# Patient Record
Sex: Female | Born: 1970 | Race: Black or African American | Hispanic: No | Smoking: Former smoker
Health system: Southern US, Community
[De-identification: ages and names within clinical notes are randomized; demographics above are authoritative.]

## PROBLEM LIST (undated history)

## (undated) DIAGNOSIS — J45909 Unspecified asthma, uncomplicated: Secondary | ICD-10-CM

---

## 2015-08-14 ENCOUNTER — Emergency Department (HOSPITAL_COMMUNITY): Payer: Self-pay

## 2015-08-14 ENCOUNTER — Encounter (HOSPITAL_COMMUNITY): Payer: Self-pay | Admitting: *Deleted

## 2015-08-14 ENCOUNTER — Emergency Department (HOSPITAL_COMMUNITY)
Admission: EM | Admit: 2015-08-14 | Discharge: 2015-08-14 | Disposition: A | Discharge status: Home / Self Care | Payer: Self-pay | Attending: Emergency Medicine | Admitting: Emergency Medicine

## 2015-08-14 DIAGNOSIS — N83201 Unspecified ovarian cyst, right side: Secondary | ICD-10-CM

## 2015-08-14 DIAGNOSIS — D270 Benign neoplasm of right ovary: Secondary | ICD-10-CM

## 2015-08-14 DIAGNOSIS — J45909 Unspecified asthma, uncomplicated: Secondary | ICD-10-CM | POA: Insufficient documentation

## 2015-08-14 DIAGNOSIS — R1031 Right lower quadrant pain: Secondary | ICD-10-CM | POA: Insufficient documentation

## 2015-08-14 HISTORY — DX: Unspecified asthma, uncomplicated: J45.909

## 2015-08-14 LAB — LIPASE, BLOOD: LIPASE: 28 U/L (ref 11–51)

## 2015-08-14 LAB — URINALYSIS, ROUTINE W REFLEX MICROSCOPIC
BILIRUBIN URINE: NEGATIVE
GLUCOSE, UA: NEGATIVE mg/dL
HGB URINE DIPSTICK: NEGATIVE
Ketones, ur: NEGATIVE mg/dL
Leukocytes, UA: NEGATIVE
Nitrite: NEGATIVE
PROTEIN: NEGATIVE mg/dL
Specific Gravity, Urine: 1.018 (ref 1.005–1.030)
pH: 5.5 (ref 5.0–8.0)

## 2015-08-14 LAB — POC URINE PREG, ED: PREG TEST UR: NEGATIVE

## 2015-08-14 LAB — COMPREHENSIVE METABOLIC PANEL
ALK PHOS: 87 U/L (ref 38–126)
ALT: 20 U/L (ref 14–54)
AST: 23 U/L (ref 15–41)
Albumin: 3.7 g/dL (ref 3.5–5.0)
Anion gap: 5 (ref 5–15)
BUN: 10 mg/dL (ref 6–20)
CALCIUM: 9.1 mg/dL (ref 8.9–10.3)
CHLORIDE: 108 mmol/L (ref 101–111)
CO2: 24 mmol/L (ref 22–32)
CREATININE: 0.77 mg/dL (ref 0.44–1.00)
GFR calc non Af Amer: >60 mL/min (ref >60)
GLUCOSE: 121 mg/dL — AB (ref 65–99)
Potassium: 4.2 mmol/L (ref 3.5–5.1)
SODIUM: 137 mmol/L (ref 135–145)
Total Bilirubin: 0.3 mg/dL (ref 0.3–1.2)
Total Protein: 6.6 g/dL (ref 6.5–8.1)

## 2015-08-14 LAB — CBC
HCT: 34.7 % — ABNORMAL LOW (ref 36.0–46.0)
Hemoglobin: 11.6 g/dL — ABNORMAL LOW (ref 12.0–15.0)
MCH: 22.7 pg — AB (ref 26.0–34.0)
MCHC: 33.4 g/dL (ref 30.0–36.0)
MCV: 67.8 fL — AB (ref 78.0–100.0)
PLATELETS: 195 10*3/uL (ref 150–400)
RBC: 5.12 MIL/uL — ABNORMAL HIGH (ref 3.87–5.11)
RDW: 14.5 % (ref 11.5–15.5)
WBC: 4.9 10*3/uL (ref 4.0–10.5)

## 2015-08-14 MED ORDER — IOPAMIDOL (ISOVUE-300) INJECTION 61%
INTRAVENOUS | Status: AC
  Administered 2015-08-14: 100 mL
  Filled 2015-08-14: qty 100

## 2015-08-14 MED ORDER — IBUPROFEN 400 MG PO TABS
400.0000 mg | ORAL_TABLET | Freq: Once | ORAL | Status: AC
  Administered 2015-08-14: 400 mg via ORAL
  Filled 2015-08-14: qty 1

## 2015-08-14 MED ORDER — IOPAMIDOL (ISOVUE-300) INJECTION 61%
INTRAVENOUS | Status: AC
  Filled 2015-08-14: qty 100

## 2015-08-14 MED ORDER — HYDROCODONE-ACETAMINOPHEN 5-325 MG PO TABS: 1.0000 | ORAL_TABLET | ORAL | Status: DC | PRN

## 2015-08-14 MED ORDER — IBUPROFEN 800 MG PO TABS: 800.0000 mg | ORAL_TABLET | Freq: Three times a day (TID) | ORAL | Status: DC

## 2015-08-14 NOTE — ED Notes (Signed)
Both pt and visitor asleep  c-t will call for the pt soon

## 2015-08-14 NOTE — ED Notes (Signed)
Returned from ultrasound.

## 2015-08-14 NOTE — ED Provider Notes (Signed)
CSN: [id number]     Arrival date & time 08/14/15  0236 History   First MD Initiated Contact with Patient 08/14/15 [phone]     Chief Complaint  Patient presents with  . Abdominal Pain     (Consider location/radiation/quality/duration/timing/severity/associated sxs/prior Treatment) HPI Comments: Patient presents to the ER for evaluation of 24 hours of pain in the right groin area. Patient reports that she has noticed a tender fullness in this area that worsens when she stands up. She denies any injury to the area. Patient reports the pain has progressively worsened. She has not had any fever, nausea, vomiting, diarrhea or constipation.  Patient is a 45 y.o. female presenting with abdominal pain.  Abdominal Pain   Past Medical History  Diagnosis Date  . Asthma    History reviewed. No pertinent past surgical history. No family history on file. Social History  Substance Use Topics  . Smoking status: Never Smoker   . Smokeless tobacco: None  . Alcohol Use: No   OB History    No data available     Review of Systems  Gastrointestinal: Positive for abdominal pain.  All other systems reviewed and are negative.     Allergies  Review of patient's allergies indicates no known allergies.  Home Medications   Prior to Admission medications   Not on File   BP 112/75 mmHg  Pulse 68  Temp(Src) 98.1 F (36.7 C) (Oral)  Resp 16  Wt 150 lb (68.04 kg)  SpO2 100%  LMP 08/07/2015 Physical Exam  Constitutional: She is oriented to person, place, and time. She appears well-developed and well-nourished. No distress.  HENT:  Head: Normocephalic and atraumatic.  Right Ear: Hearing normal.  Left Ear: Hearing normal.  Nose: Nose normal.  Mouth/Throat: Oropharynx is clear and moist and mucous membranes are normal.  Eyes: Conjunctivae and EOM are normal. Pupils are equal, round, and reactive to light.  Neck: Normal range of motion. Neck supple.  Cardiovascular: Regular rhythm, S1 normal  and S2 normal.  Exam reveals no gallop and no friction rub.   No murmur heard. Pulmonary/Chest: Effort normal and breath sounds normal. No respiratory distress. She exhibits no tenderness.  Abdominal: Soft. Normal appearance and bowel sounds are normal. Mass: Tender fullness right inguinal ligament region. There is no hepatosplenomegaly. There is no tenderness. There is no rebound, no guarding, no tenderness at McBurney's point and negative Murphy's sign. No hernia.  Musculoskeletal: Normal range of motion.  Neurological: She is alert and oriented to person, place, and time. She has normal strength. No cranial nerve deficit or sensory deficit. Coordination normal. GCS eye subscore is 4. GCS verbal subscore is 5. GCS motor subscore is 6.  Skin: Skin is warm, dry and intact. No rash noted. No cyanosis.  Psychiatric: She has a normal mood and affect. Her speech is normal and behavior is normal. Thought content normal.  Nursing note and vitals reviewed.   ED Course  Procedures (including critical care time) Labs Review Labs Reviewed  COMPREHENSIVE METABOLIC PANEL - Abnormal; Notable for the following:    Glucose, Bld 121 (*)    All other components within normal limits  CBC - Abnormal; Notable for the following:    RBC 5.12 (*)    Hemoglobin 11.6 (*)    HCT 34.7 (*)    MCV 67.8 (*)    MCH 22.7 (*)    All other components within normal limits  LIPASE, BLOOD  URINALYSIS, ROUTINE W REFLEX MICROSCOPIC (NOT AT [facility])  POC URINE PREG, ED    Imaging Review No results found. I have personally reviewed and evaluated these images and lab results as part of my medical decision-making.   EKG Interpretation None      MDM   Final diagnoses:  Right lower quadrant abdominal pain    Patient presents to the ER for evaluation of right lower abdominal pain. Symptoms present for 24 hours. Patient does not have tenderness at McBurney's point. Area of pain is below the area of the pelvis as well.  She indicates an area in the inguinal crease that is tender and painful with movement. There is some fullness in the region that she feels is a mass. Will perform CT scan to evaluate for possible hernia. Sign out to oncoming ER physician to follow up CT.    Orpah Greek, MD 08/14/15 [phone]

## 2015-08-14 NOTE — ED Notes (Signed)
Pt sleeping. 

## 2015-08-14 NOTE — Discharge Instructions (Signed)
It was our pleasure to provide your ER care today - we hope that you feel better.  You may take motrin or aleve as need for pain.   Your imaging studies were read as showing:  IMPRESSION: 3.2 cm predominantly solid mass with substantial hyperechoic component is noted in right ovary which corresponds to lesion seen on CT scan. This is most consistent with dermoid tumor.  Follow up with ob/gyn doctor in the next few weeks - see referral - call office to arrange appointment. Discuss ultrasound results with them.   Return to ER if worse, new symptoms, fevers, worsening/severe pain, persistent vomiting, other concern.       Abdominal Pain, Adult Many things can cause abdominal pain. Usually, abdominal pain is not caused by a disease and will improve without treatment. It can often be observed and treated at home. Your health care provider will do a physical exam and possibly order blood tests and X-rays to help determine the seriousness of your pain. However, in many cases, more time must pass before a clear cause of the pain can be found. Before that point, your health care provider may not know if you need more testing or further treatment. HOME CARE INSTRUCTIONS Monitor your abdominal pain for any changes. The following actions may help to alleviate any discomfort you are experiencing:  Only take over-the-counter or prescription medicines as directed by your health care provider.  Do not take laxatives unless directed to do so by your health care provider.  Try a clear liquid diet (broth, tea, or water) as directed by your health care provider. Slowly move to a bland diet as tolerated. SEEK MEDICAL CARE IF:  You have unexplained abdominal pain.  You have abdominal pain associated with nausea or diarrhea.  You have pain when you urinate or have a bowel movement.  You experience abdominal pain that wakes you in the night.  You have abdominal pain that is worsened or improved by  eating food.  You have abdominal pain that is worsened with eating fatty foods.  You have a fever. SEEK IMMEDIATE MEDICAL CARE IF:  Your pain does not go away within 2 hours.  You keep throwing up (vomiting).  Your pain is felt only in portions of the abdomen, such as the right side or the left lower portion of the abdomen.  You pass bloody or black tarry stools. MAKE SURE YOU:  Understand these instructions.  Will watch your condition.  Will get help right away if you are not doing well or get worse.   This information is not intended to replace advice given to you by your health care provider. Make sure you discuss any questions you have with your health care provider.   Document Released: 12/14/2004 Document Revised: 11/25/2014 Document Reviewed: 11/13/2012 Elsevier Interactive Patient Education 2016 [employer]   Ovarian Cyst An ovarian cyst is a fluid-filled sac that forms on an ovary. The ovaries are small organs that produce eggs in women. Various types of cysts can form on the ovaries. Most are not cancerous. Many do not cause problems, and they often go away on their own. Some may cause symptoms and require treatment. Common types of ovarian cysts include:  Functional cysts--These cysts may occur every month during the menstrual cycle. This is normal. The cysts usually go away with the next menstrual cycle if the woman does not get pregnant. Usually, there are no symptoms with a functional cyst.  Endometrioma cysts--These cysts form from the tissue  that lines the uterus. They are also called "chocolate cysts" because they become filled with blood that turns brown. This type of cyst can cause pain in the lower abdomen during intercourse and with your menstrual period.  Cystadenoma cysts--This type develops from the cells on the outside of the ovary. These cysts can get very big and cause lower abdomen pain and pain with intercourse. This type of cyst can twist on itself,  cut off its blood supply, and cause severe pain. It can also easily rupture and cause a lot of pain.  Dermoid cysts--This type of cyst is sometimes found in both ovaries. These cysts may contain different kinds of body tissue, such as skin, teeth, hair, or cartilage. They usually do not cause symptoms unless they get very big.  Theca lutein cysts--These cysts occur when too much of a certain hormone (human chorionic gonadotropin) is produced and overstimulates the ovaries to produce an egg. This is most common after procedures used to assist with the conception of a baby (in vitro fertilization). CAUSES   Fertility drugs can cause a condition in which multiple large cysts are formed on the ovaries. This is called ovarian hyperstimulation syndrome.  A condition called polycystic ovary syndrome can cause hormonal imbalances that can lead to nonfunctional ovarian cysts. SIGNS AND SYMPTOMS  Many ovarian cysts do not cause symptoms. If symptoms are present, they may include:  Pelvic pain or pressure.  Pain in the lower abdomen.  Pain during sexual intercourse.  Increasing girth (swelling) of the abdomen.  Abnormal menstrual periods.  Increasing pain with menstrual periods.  Stopping having menstrual periods without being pregnant. DIAGNOSIS  These cysts are commonly found during a routine or annual pelvic exam. Tests may be ordered to find out more about the cyst. These tests may include:  Ultrasound.  X-ray of the pelvis.  CT scan.  MRI.  Blood tests. TREATMENT  Many ovarian cysts go away on their own without treatment. Your health care provider may want to check your cyst regularly for 2-3 months to see if it changes. For women in menopause, it is particularly important to monitor a cyst closely because of the higher rate of ovarian cancer in menopausal women. When treatment is needed, it may include any of the following:  A procedure to drain the cyst (aspiration). This may be  done using a long needle and ultrasound. It can also be done through a laparoscopic procedure. This involves using a thin, lighted tube with a tiny camera on the end (laparoscope) inserted through a small incision.  Surgery to remove the whole cyst. This may be done using laparoscopic surgery or an open surgery involving a larger incision in the lower abdomen.  Hormone treatment or birth control pills. These methods are sometimes used to help dissolve a cyst. HOME CARE INSTRUCTIONS   Only take over-the-counter or prescription medicines as directed by your health care provider.  Follow up with your health care provider as directed.  Get regular pelvic exams and Pap tests. SEEK MEDICAL CARE IF:   Your periods are late, irregular, or painful, or they stop.  Your pelvic pain or abdominal pain does not go away.  Your abdomen becomes larger or swollen.  You have pressure on your bladder or trouble emptying your bladder completely.  You have pain during sexual intercourse.  You have feelings of fullness, pressure, or discomfort in your stomach.  You lose weight for no apparent reason.  You feel generally ill.  You become constipated.  You lose your appetite.  You develop acne.  You have an increase in body and facial hair.  You are gaining weight, without changing your exercise and eating habits.  You think you are pregnant. SEEK IMMEDIATE MEDICAL CARE IF:   You have increasing abdominal pain.  You feel sick to your stomach (nauseous), and you throw up (vomit).  You develop a fever that comes on suddenly.  You have abdominal pain during a bowel movement.  Your menstrual periods become heavier than usual. MAKE SURE YOU:  Understand these instructions.  Will watch your condition.  Will get help right away if you are not doing well or get worse.   This information is not intended to replace advice given to you by your health care provider. Make sure you discuss any  questions you have with your health care provider.   Document Released: 03/06/2005 Document Revised: 03/11/2013 Document Reviewed: 11/11/2012 Elsevier Interactive Patient Education [employer].

## 2015-08-14 NOTE — ED Notes (Signed)
i triaged this pt earlier no change in  Her condition  Alert no distress

## 2015-08-14 NOTE — ED Provider Notes (Signed)
Signed out by Dr Betsey Holiday that patient with CT pending, and that feels likely patient able to be d/cd to home post ct.   Ct results noted, will get u/s.   U/s and ct c/w ovarian cystic ?dermoid.  Discussed results w pt and need gyn follow up.  Pt afeb. Recheck abd soft no tenderness.     Lajean Saver, MD 08/14/15 1055

## 2015-08-14 NOTE — ED Notes (Signed)
The pt is c/o rt lower quadrant pain for 24 hours  No  n v or diarrhea.. lmp last week

## 2015-09-10 ENCOUNTER — Encounter: Payer: Self-pay | Admitting: Obstetrics & Gynecology

## 2015-09-10 ENCOUNTER — Ambulatory Visit (INDEPENDENT_AMBULATORY_CARE_PROVIDER_SITE_OTHER): Payer: Self-pay | Admitting: Obstetrics & Gynecology

## 2015-09-10 VITALS — BP 113/71 | HR 63 | Wt 146.7 lb

## 2015-09-10 DIAGNOSIS — D270 Benign neoplasm of right ovary: Secondary | ICD-10-CM

## 2015-09-10 MED ORDER — NAPROXEN 500 MG PO TABS: 500.0000 mg | ORAL_TABLET | Freq: Two times a day (BID) | ORAL | Status: DC

## 2015-09-10 NOTE — Patient Instructions (Signed)
Unilateral Salpingo-Oophorectomy Unilateral salpingo-oophorectomy is the surgical removal of one fallopian tube and ovary. The ovaries are small organs that produce eggs in women. The fallopian tubes transport the egg from the ovary to the womb (uterus). A unilateral salpingo-oophorectomy may be done for various reasons, including:  Infection in the fallopian tube and ovary.  Scar tissue in the fallopian tube and ovary (adhesions).  A cyst or tumor on the ovary.  A need to remove the fallopian tube and ovary when removing the uterus.  Cancer of the fallopian tube or ovary. The removal of one fallopian tube and ovary will not prevent you from becoming pregnant, put you into menopause, or cause problems with your menstrual periods or sex drive. LET [facility] CARE PROVIDER KNOW ABOUT:  Any allergies you have.  All medicines you are taking, including vitamins, herbs, eye drops, creams, and over-the-counter medicines.  Previous problems you or members of your family have had with the use of anesthetics.  Any blood disorders you have.  Previous surgeries you have had.  Medical conditions you have. RISKS AND COMPLICATIONS  Generally, this is a safe procedure. However, as with any procedure, complications can occur. Possible complications include:  Injury to surrounding organs.  Bleeding.  Infection.  Blood clots in the legs or lungs.  Problems related to anesthesia. BEFORE THE PROCEDURE  Ask your health care provider about changing or stopping your regular medicines. You may need to stop taking certain medicines, such as aspirin or blood thinners, at least 1 week before the surgery.  Do not eat or drink anything for at least 8 hours before the surgery.  If you smoke, do not smoke for at least 2 weeks before the surgery.  Make plans to have someone drive you home after the procedure or after your hospital stay. Also arrange for someone to help you with activities during  recovery. PROCEDURE  You will be given medicine to help you relax before the procedure (sedative). You will then be given medicine to make you sleep through the procedure (general anesthetic). These medicines will be given through an IV access tube that is put into one of your veins.  Once you are asleep, your lower abdomen will be shaved and cleaned. A thin, flexible tube (catheter) will be placed in your bladder.  The surgeon may use a laparoscopic, robotic, or open technique for this surgery:  In the laparoscopic technique, the surgery is done through two small cuts (incisions) in the abdomen. A thin, lighted tube with a tiny camera on the end (laparoscope) is inserted into one of the incisions. The tools needed for the procedure are put through the other incision.  A robotic technique may be chosen to perform complex surgery in a small space. In the robotic technique, small incisions are made. A camera and surgical instruments are passed through the incisions. Surgical instruments are controlled with the help of a robotic arm.  In the open technique, the surgery is done through one large incision in the abdomen.  Using any of these techniques, the surgeon will remove the fallopian tube and ovary. The blood vessels will be clamped and tied.  The surgeon will then use staples or stitches to close the incision or incisions. AFTER THE PROCEDURE  You will be taken to a recovery area where your progress will be monitored for 1-3 hours. Your blood pressure, pulse, and temperature will be checked often. You will remain in the recovery area until you are stable and waking up.  If the laparoscopic technique was used, you may be allowed to go home after several hours. You may have some shoulder pain. This is normal and usually goes away in a day or two.  If the open technique was used, you will be admitted to the hospital for a couple of days.  You will be given pain medicine as necessary.  The  IV tube and catheter will be removed before you are discharged.   This information is not intended to replace advice given to you by your health care provider. Make sure you discuss any questions you have with your health care provider.   Document Released: 01/01/2009 Document Revised: 03/11/2013 Document Reviewed: 08/28/2012 Elsevier Interactive Patient Education [employer].

## 2015-09-10 NOTE — Progress Notes (Signed)
CLINIC ENCOUNTER NOTE  History:  45 y.o. [id number] here today for discussion of surgical management of probable dermoid cyst. She denies any abnormal vaginal discharge, bleeding, pelvic pain or other concerns.   Past Medical History  Diagnosis Date  . Asthma    History reviewed. No pertinent past surgical history.  The following portions of the patient's history were reviewed and updated as appropriate: allergies, current medications, past family history, past medical history, past social history, past surgical history and problem list.   Health Maintenance:  Normal pap in 2016.  Review of Systems:  Pertinent items noted in HPI and remainder of comprehensive ROS otherwise negative.  Objective:  Physical Exam BP 113/71 mmHg  Pulse 63  Wt 146 lb 11.2 oz (66.543 kg)  LMP 08/07/2015 CONSTITUTIONAL: Well-developed, well-nourished female in no acute distress.  HENT:  Normocephalic, atraumatic. External right and left ear normal. Oropharynx is clear and moist EYES: Conjunctivae and EOM are normal. Pupils are equal, round, and reactive to light. No scleral icterus.  NECK: Normal range of motion, supple, no masses SKIN: Skin is warm and dry. No rash noted. Not diaphoretic. No erythema. No pallor. NEUROLOGIC: Alert and oriented to person, place, and time. Normal reflexes, muscle tone coordination. No cranial nerve deficit noted. PSYCHIATRIC: Normal mood and affect. Normal behavior. Normal judgment and thought content. CARDIOVASCULAR: Normal heart rate noted RESPIRATORY: Effort and breath sounds normal, no problems with respiration noted ABDOMEN: Soft, no distention noted.   PELVIC: Normal appearing external genitalia. Normal uterine size, no other palpable masses,mild uterine tenderness and bilateral adnexal tenderness.  MUSCULOSKELETAL: Normal range of motion. No edema noted.  Labs and Imaging US Transvaginal Non-ob  08/14/2015  CLINICAL DATA:  Right ovarian cyst seen on CT scan.  EXAM: TRANSABDOMINAL AND TRANSVAGINAL ULTRASOUND OF PELVIS TECHNIQUE: Both transabdominal and transvaginal ultrasound examinations of the pelvis were performed. Transabdominal technique was performed for global imaging of the pelvis including uterus, ovaries, adnexal regions, and pelvic cul-de-sac. It was necessary to proceed with endovaginal exam following the transabdominal exam to visualize the endometrium and ovaries. COMPARISON:  CT scan of same day. FINDINGS: Uterus Measurements: 10.5 x 6.4 x 5.3 cm. No fibroids or other mass visualized. Endometrium Thickness: 7.8 mm which is within normal limits for patient of reproductive age. No focal abnormality visualized. Right ovary Measurements: 4.7 x 4.1 x 2.1 cm. Complex but predominantly solid mass measuring 3.2 x 2.7 x 2.2 cm is noted which corresponds to abnormality seen on CT scan. It has a substantial hyperechoic solid component and is most consistent with dermoid tumor. Left ovary Measurements: 2.7 x 1.8 x 2.1 cm. Probable small hemorrhagic cyst measuring 1.1 cm is noted. Other findings Trace amount of free fluid is noted which most likely is physiologic. IMPRESSION: 3.2 cm predominantly solid mass with substantial hyperechoic component is noted in right ovary which corresponds to lesion seen on CT scan. This is most consistent with dermoid tumor. If this abnormality is not surgically removed, followup ultrasound in 1 year is recommended to ensure stability. Electronically Signed   By: Marijo Conception, M.D.   On: 08/14/2015 10:44   US Pelvis Complete  08/14/2015  CLINICAL DATA:  Right ovarian cyst seen on CT scan. EXAM: TRANSABDOMINAL AND TRANSVAGINAL ULTRASOUND OF PELVIS TECHNIQUE: Both transabdominal and transvaginal ultrasound examinations of the pelvis were performed. Transabdominal technique was performed for global imaging of the pelvis including uterus, ovaries, adnexal regions, and pelvic cul-de-sac. It was necessary to proceed with endovaginal exam  following  the transabdominal exam to visualize the endometrium and ovaries. COMPARISON:  CT scan of same day. FINDINGS: Uterus Measurements: 10.5 x 6.4 x 5.3 cm. No fibroids or other mass visualized. Endometrium Thickness: 7.8 mm which is within normal limits for patient of reproductive age. No focal abnormality visualized. Right ovary Measurements: 4.7 x 4.1 x 2.1 cm. Complex but predominantly solid mass measuring 3.2 x 2.7 x 2.2 cm is noted which corresponds to abnormality seen on CT scan. It has a substantial hyperechoic solid component and is most consistent with dermoid tumor. Left ovary Measurements: 2.7 x 1.8 x 2.1 cm. Probable small hemorrhagic cyst measuring 1.1 cm is noted. Other findings Trace amount of free fluid is noted which most likely is physiologic. IMPRESSION: 3.2 cm predominantly solid mass with substantial hyperechoic component is noted in right ovary which corresponds to lesion seen on CT scan. This is most consistent with dermoid tumor. If this abnormality is not surgically removed, followup ultrasound in 1 year is recommended to ensure stability. Electronically Signed   By: Marijo Conception, M.D.   On: 08/14/2015 10:44   Ct Abdomen Pelvis W Contrast  08/14/2015  CLINICAL DATA:  Acute right lower quadrant abdominal pain. EXAM: CT ABDOMEN AND PELVIS WITH CONTRAST TECHNIQUE: Multidetector CT imaging of the abdomen and pelvis was performed using the standard protocol following bolus administration of intravenous contrast. CONTRAST:  100 mL ISOVUE-300 IOPAMIDOL (ISOVUE-300) INJECTION 61% COMPARISON:  None. FINDINGS: Visualized lung bases are unremarkable. Old healed fracture seen involving the anterior portion of the right iliac wing. No other osseous abnormality is noted. No gallstones are noted. The liver, spleen and pancreas are unremarkable. Adrenal glands and kidneys appear normal. The appendix appears normal. No hydronephrosis or renal obstruction is noted. There is no evidence of bowel  obstruction. In the posterior portion of the pelvis on the right, fat containing oval-shaped density measuring 3.8 x 2.5 x 3.0 cm is noted with solid components. This is most consistent with dermoid tumor, most likely arising from the right ovary. The uterus appears normal. Left ovary is not clearly visualized. Urinary bladder appears normal. No significant adenopathy is noted. No abnormal fluid collection is noted. IMPRESSION: Normal appendix. No hydronephrosis or renal obstruction is noted. No definite renal or ureteral calculi are noted. 3.8 cm fat containing oval-shaped density seen in posterior portion of right pelvis which is most consistent with ovarian dermoid tumor. Pelvic ultrasound is recommended for further evaluation. Electronically Signed   By: Marijo Conception, M.D.   On: 08/14/2015 07:50    Assessment & Plan:  1. Dermoid cyst of right ovary Patient desires surgical management with laparoscopic right salpingoophorectomy.  The risks of surgery were discussed in detail with the patient including but not limited to: bleeding which may require transfusion or reoperation; infection which may require prolonged hospitalization or re-hospitalization and antibiotic therapy; injury to bowel, bladder, ureters and major vessels or other surrounding organs; need for additional procedures including laparotomy; thromboembolic phenomenon, incisional problems and other postoperative or anesthesia complications.  Patient was told that the likelihood that her condition and symptoms will be treated effectively with this surgical management was very high; the postoperative expectations were also discussed in detail. The patient also understands the alternative treatment options which were discussed in full. All questions were answered.  She was told that she will be contacted by our surgical scheduler regarding the time and date of her surgery; routine preoperative instructions of having nothing to eat or drink after  midnight  on the day prior to surgery and also coming to the hospital 1.5 hours prior to her time of surgery were also emphasized.  She was told she may be called for a preoperative appointment about a week prior to surgery and will be given further preoperative instructions at that visit. Printed patient education handouts about the procedure were given to the patient to review at home.  CA125 drawn today, will follow up results and manage accordingly. Naproxen prescribed for pain as needed.  Please refer to After Visit Summary for other counseling recommendations.   Total face-to-face time with patient: 30 minutes. Over 50% of encounter was spent on counseling and coordination of care.   Verita Schneiders, MD, [place] Attending Obstetrician & Gynecologist, [place] for [facility]

## 2015-09-11 LAB — CA 125: CA 125: 13 U/mL (ref <35)

## 2015-09-20 ENCOUNTER — Encounter (HOSPITAL_COMMUNITY): Payer: Self-pay | Admitting: *Deleted

## 2015-10-01 ENCOUNTER — Encounter (HOSPITAL_COMMUNITY): Payer: Self-pay | Admitting: *Deleted

## 2015-10-19 ENCOUNTER — Encounter (HOSPITAL_COMMUNITY)
Admission: RE | Disposition: A | Discharge status: Home / Self Care | Payer: Self-pay | Source: Ambulatory Visit | Attending: Obstetrics & Gynecology

## 2015-10-19 ENCOUNTER — Encounter: Payer: Self-pay | Admitting: Obstetrics & Gynecology

## 2015-10-19 ENCOUNTER — Ambulatory Visit (HOSPITAL_COMMUNITY): Payer: Self-pay | Admitting: Anesthesiology

## 2015-10-19 ENCOUNTER — Ambulatory Visit (HOSPITAL_COMMUNITY)
Admission: RE | Admit: 2015-10-19 | Discharge: 2015-10-19 | Disposition: A | Discharge status: Home / Self Care | Payer: Self-pay | Source: Ambulatory Visit | Attending: Obstetrics & Gynecology | Admitting: Obstetrics & Gynecology

## 2015-10-19 ENCOUNTER — Encounter (HOSPITAL_COMMUNITY): Payer: Self-pay

## 2015-10-19 DIAGNOSIS — Z87891 Personal history of nicotine dependence: Secondary | ICD-10-CM | POA: Insufficient documentation

## 2015-10-19 DIAGNOSIS — D270 Benign neoplasm of right ovary: Secondary | ICD-10-CM | POA: Insufficient documentation

## 2015-10-19 HISTORY — PX: LAPAROSCOPIC SALPINGO OOPHERECTOMY: SHX5927

## 2015-10-19 LAB — PREGNANCY, URINE: Preg Test, Ur: NEGATIVE

## 2015-10-19 SURGERY — SALPINGO-OOPHORECTOMY, LAPAROSCOPIC
Anesthesia: General | Site: Abdomen | Laterality: Right

## 2015-10-19 MED ORDER — DEXAMETHASONE SODIUM PHOSPHATE 10 MG/ML IJ SOLN
INTRAMUSCULAR | Status: AC
  Filled 2015-10-19: qty 1

## 2015-10-19 MED ORDER — BUPIVACAINE HCL (PF) 0.5 % IJ SOLN
INTRAMUSCULAR | Status: DC | PRN
  Administered 2015-10-19: 10 mL

## 2015-10-19 MED ORDER — MIDAZOLAM HCL 2 MG/2ML IJ SOLN
INTRAMUSCULAR | Status: AC
  Filled 2015-10-19: qty 2

## 2015-10-19 MED ORDER — LIDOCAINE HCL (CARDIAC) 20 MG/ML IV SOLN
INTRAVENOUS | Status: DC | PRN
  Administered 2015-10-19: 60 mg via INTRAVENOUS

## 2015-10-19 MED ORDER — FENTANYL CITRATE (PF) 100 MCG/2ML IJ SOLN
25.0000 ug | INTRAMUSCULAR | Status: DC | PRN
  Administered 2015-10-19 (×2): 25 ug via INTRAVENOUS

## 2015-10-19 MED ORDER — KETOROLAC TROMETHAMINE 30 MG/ML IJ SOLN
INTRAMUSCULAR | Status: DC | PRN
  Administered 2015-10-19: 30 mg via INTRAVENOUS

## 2015-10-19 MED ORDER — SCOPOLAMINE 1 MG/3DAYS TD PT72
MEDICATED_PATCH | TRANSDERMAL | Status: AC
  Administered 2015-10-19: 1.5 mg via TRANSDERMAL
  Filled 2015-10-19: qty 1

## 2015-10-19 MED ORDER — LIDOCAINE HCL (CARDIAC) 20 MG/ML IV SOLN
INTRAVENOUS | Status: AC
  Filled 2015-10-19: qty 5

## 2015-10-19 MED ORDER — LACTATED RINGERS IV SOLN: INTRAVENOUS | Status: DC

## 2015-10-19 MED ORDER — KETOROLAC TROMETHAMINE 30 MG/ML IJ SOLN
INTRAMUSCULAR | Status: AC
  Filled 2015-10-19: qty 1

## 2015-10-19 MED ORDER — MIDAZOLAM HCL 2 MG/2ML IJ SOLN
INTRAMUSCULAR | Status: DC | PRN
  Administered 2015-10-19: 2 mg via INTRAVENOUS

## 2015-10-19 MED ORDER — ONDANSETRON HCL 4 MG/2ML IJ SOLN
INTRAMUSCULAR | Status: DC | PRN
  Administered 2015-10-19: 4 mg via INTRAVENOUS

## 2015-10-19 MED ORDER — FENTANYL CITRATE (PF) 100 MCG/2ML IJ SOLN
INTRAMUSCULAR | Status: AC
  Administered 2015-10-19: 25 ug via INTRAVENOUS
  Filled 2015-10-19: qty 2

## 2015-10-19 MED ORDER — DEXAMETHASONE SODIUM PHOSPHATE 10 MG/ML IJ SOLN
INTRAMUSCULAR | Status: DC | PRN
  Administered 2015-10-19: 4 mg via INTRAVENOUS

## 2015-10-19 MED ORDER — PROPOFOL 10 MG/ML IV BOLUS
INTRAVENOUS | Status: AC
  Filled 2015-10-19: qty 20

## 2015-10-19 MED ORDER — OXYCODONE-ACETAMINOPHEN 5-325 MG PO TABS: 1.0000 | ORAL_TABLET | Freq: Four times a day (QID) | ORAL | 0 refills | Status: DC | PRN

## 2015-10-19 MED ORDER — ROCURONIUM BROMIDE 100 MG/10ML IV SOLN
INTRAVENOUS | Status: AC
  Filled 2015-10-19: qty 1

## 2015-10-19 MED ORDER — GLYCOPYRROLATE 0.2 MG/ML IJ SOLN
INTRAMUSCULAR | Status: AC
  Filled 2015-10-19: qty 2

## 2015-10-19 MED ORDER — GLYCOPYRROLATE 0.2 MG/ML IJ SOLN
INTRAMUSCULAR | Status: DC | PRN
  Administered 2015-10-19: 0.4 mg via INTRAVENOUS

## 2015-10-19 MED ORDER — BUPIVACAINE HCL (PF) 0.5 % IJ SOLN
INTRAMUSCULAR | Status: AC
  Filled 2015-10-19: qty 30

## 2015-10-19 MED ORDER — NEOSTIGMINE METHYLSULFATE 10 MG/10ML IV SOLN
INTRAVENOUS | Status: DC | PRN
  Administered 2015-10-19: 3 mg via INTRAVENOUS

## 2015-10-19 MED ORDER — LACTATED RINGERS IV SOLN
INTRAVENOUS | Status: DC
  Administered 2015-10-19 (×2): via INTRAVENOUS

## 2015-10-19 MED ORDER — FENTANYL CITRATE (PF) 250 MCG/5ML IJ SOLN
INTRAMUSCULAR | Status: AC
  Filled 2015-10-19: qty 5

## 2015-10-19 MED ORDER — NEOSTIGMINE METHYLSULFATE 10 MG/10ML IV SOLN
INTRAVENOUS | Status: AC
  Filled 2015-10-19: qty 1

## 2015-10-19 MED ORDER — FENTANYL CITRATE (PF) 100 MCG/2ML IJ SOLN
INTRAMUSCULAR | Status: DC | PRN
  Administered 2015-10-19 (×3): 50 ug via INTRAVENOUS
  Administered 2015-10-19: 100 ug via INTRAVENOUS

## 2015-10-19 MED ORDER — CEFAZOLIN SODIUM-DEXTROSE 2-4 GM/100ML-% IV SOLN
2.0000 g | INTRAVENOUS | Status: AC
  Administered 2015-10-19: 2 g via INTRAVENOUS

## 2015-10-19 MED ORDER — PROPOFOL 10 MG/ML IV BOLUS
INTRAVENOUS | Status: DC | PRN
  Administered 2015-10-19: 200 mg via INTRAVENOUS

## 2015-10-19 MED ORDER — IBUPROFEN 600 MG PO TABS: 600.0000 mg | ORAL_TABLET | Freq: Four times a day (QID) | ORAL | 3 refills | Status: DC | PRN

## 2015-10-19 MED ORDER — SUGAMMADEX SODIUM 200 MG/2ML IV SOLN
INTRAVENOUS | Status: AC
  Filled 2015-10-19: qty 2

## 2015-10-19 MED ORDER — ONDANSETRON HCL 4 MG/2ML IJ SOLN
INTRAMUSCULAR | Status: AC
  Filled 2015-10-19: qty 2

## 2015-10-19 MED ORDER — SCOPOLAMINE 1 MG/3DAYS TD PT72
1.0000 | MEDICATED_PATCH | Freq: Once | TRANSDERMAL | Status: DC
  Administered 2015-10-19: 1.5 mg via TRANSDERMAL

## 2015-10-19 MED ORDER — ROCURONIUM BROMIDE 100 MG/10ML IV SOLN
INTRAVENOUS | Status: DC | PRN
  Administered 2015-10-19: 40 mg via INTRAVENOUS

## 2015-10-19 MED ORDER — DOCUSATE SODIUM 100 MG PO CAPS: 100.0000 mg | ORAL_CAPSULE | Freq: Two times a day (BID) | ORAL | 2 refills | Status: DC | PRN

## 2015-10-19 SURGICAL SUPPLY — 29 items
CABLE HIGH FREQUENCY MONO STRZ (ELECTRODE) IMPLANT
CATH ROBINSON RED A/P 16FR (CATHETERS) ×3 IMPLANT
CLOTH BEACON ORANGE TIMEOUT ST (SAFETY) ×3 IMPLANT
DRSG COVADERM PLUS 2X2 (GAUZE/BANDAGES/DRESSINGS) IMPLANT
DRSG OPSITE POSTOP 3X4 (GAUZE/BANDAGES/DRESSINGS) ×3 IMPLANT
GLOVE BIO SURGEON STRL SZ 6.5 (GLOVE) ×2 IMPLANT
GLOVE BIO SURGEONS STRL SZ 6.5 (GLOVE) ×1
GLOVE BIOGEL PI IND STRL 7.0 (GLOVE) ×3 IMPLANT
GLOVE BIOGEL PI INDICATOR 7.0 (GLOVE) ×6
GLOVE ECLIPSE 7.0 STRL STRAW (GLOVE) ×3 IMPLANT
GOWN STRL REUS W/TWL LRG LVL3 (GOWN DISPOSABLE) ×6 IMPLANT
LIQUID BAND (GAUZE/BANDAGES/DRESSINGS) ×3 IMPLANT
NS IRRIG 1000ML POUR BTL (IV SOLUTION) ×3 IMPLANT
PACK LAPAROSCOPY BASIN (CUSTOM PROCEDURE TRAY) ×3 IMPLANT
PAD TRENDELENBURG POSITION (MISCELLANEOUS) ×3 IMPLANT
POUCH SPECIMEN RETRIEVAL 10MM (ENDOMECHANICALS) ×3 IMPLANT
SCRUB PCMX 4 OZ (MISCELLANEOUS) ×6 IMPLANT
SEALER TISSUE G2 CVD JAW 35 (ENDOMECHANICALS) IMPLANT
SEALER TISSUE G2 CVD JAW 45CM (ENDOMECHANICALS)
SET IRRIG TUBING LAPAROSCOPIC (IRRIGATION / IRRIGATOR) IMPLANT
SHEARS HARMONIC ACE PLUS 36CM (ENDOMECHANICALS) ×3 IMPLANT
SLEEVE XCEL OPT CAN 5 100 (ENDOMECHANICALS) ×3 IMPLANT
SUT MNCRL AB 4-0 PS2 18 (SUTURE) ×6 IMPLANT
SUT VICRYL 0 UR6 27IN ABS (SUTURE) ×3 IMPLANT
TOWEL OR 17X24 6PK STRL BLUE (TOWEL DISPOSABLE) ×6 IMPLANT
TRAY FOLEY CATH SILVER 14FR (SET/KITS/TRAYS/PACK) IMPLANT
TROCAR XCEL NON-BLD 11X100MML (ENDOMECHANICALS) ×3 IMPLANT
TROCAR XCEL NON-BLD 5MMX100MML (ENDOMECHANICALS) ×3 IMPLANT
WATER STERILE IRR 1000ML POUR (IV SOLUTION) ×6 IMPLANT

## 2015-10-19 NOTE — Transfer of Care (Signed)
Immediate Anesthesia Transfer of Care Note  Patient: Christina Arias  Procedure(s) Performed: Procedure(s): LAPAROSCOPIC  Right SALPINGO OOPHORECTOMY (Right)  Patient Location: PACU  Anesthesia Type:General  Level of Consciousness: awake, alert  and oriented  Airway & Oxygen Therapy: Patient Spontanous Breathing and Patient connected to nasal cannula oxygen  Post-op Assessment: Report given to RN and Post -op Vital signs reviewed and stable  Post vital signs: Reviewed and stable  Last Vitals: There were no vitals filed for this visit.  Last Pain: There were no vitals filed for this visit.       Complications: No apparent anesthesia complications

## 2015-10-19 NOTE — Op Note (Signed)
Christina Arias PROCEDURE DATE: 10/19/2015  PREOPERATIVE DIAGNOSES: Right ovarian dermoid cyst POSTOPERATIVE DIAGNOSES: The same PROCEDURE: Laparoscopic right salpingoophorectomy SURGEON:  Dr. Verita Schneiders ASSISTANT: Dr. Clovia Cuff ANESTHESIOLOGIST: Dr. Neoma Laming  INDICATIONS: 45 y.o. [id number] with aforementioned preoperative diagnoses here today for definitive surgical management.   Risks of surgery were discussed with the patient including but not limited to: bleeding which may require transfusion or reoperation; infection which may require antibiotics; injury to bowel, bladder, ureters or other surrounding organs; need for additional procedures including laparotomy; thromboembolic phenomenon, incisional problems and other postoperative/anesthesia complications. Written informed consent was obtained.    FINDINGS:  Small uterus, right adnexa with 4 cm right dermoid cyst. Normal left adnexa  .  No evidence of endometriosis, adhesions or any other pelvic abnormality.  Mild perihepatic adhesions, otherwise normal upper abdomen.  ANESTHESIA:    General INTRAVENOUS FLUIDS: 1100 ml ESTIMATED BLOOD LOSS: 5 ml SPECIMENS:  Right ovary and fallopian tube COMPLICATIONS: None immediate   PROCEDURE IN DETAIL:  The patient received intravenous antibiotics and had sequential compression devices applied to her lower extremities while in the preoperative area.  She was then taken to the operating room where general anesthesia was administered and was found to be adequate.  She was placed in the dorsal lithotomy position, and was prepped and draped in a sterile manner.  A Foley catheter was inserted into her bladder and attached to constant drainage and a uterine manipulator was then advanced into the uterus . After an adequate timeout was performed, attention was then turned to the patient's abdomen where a 11-mm skin incision was made in the umbilical fold.  The Optiview 11-mm trocar and sleeve were then  advanced without difficulty with the laparoscope under direct visualization into the abdomen.  The abdomen was then insufflated with carbon dioxide gas.  Adequate pneumoperitoneum was obtained.  A survey of the patient's pelvis and abdomen revealed the findings above. Bilateral 5-mm lower quadrant ports were then placed under direct visualization. On the right side, the uteroovarian ligament was then clamped and transected with the Harmonic device.  The right infundibulopelvic ligament was also clamped and transected allowing for salpingooophorectomy.  Excellent hemostasis was noted. The specimen was then removed from the abdomen through the 11-mm port using an Endocatch bag, under direct visualization.  The operative site was surveyed, and it was found to be hemostatic.  No intraoperative injury to other surrounding organs was noted.  The abdomen was desufflated and all instruments were then removed from the patient's abdomen. The fascial incision of the umbilicus was closed with a 0 Vicryl figure of eight stitch.  All skin incisions were closed with 4-0 Monocryl subcuticular stitches.  The patient will be discharged to home as per PACU criteria.  Routine postoperative instructions given.  She was prescribed Percocet, Ibuprofen and Colace.  She will follow up in the clinic in 2-3 weeks for postoperative evaluation.  Verita Schneiders, MD, [place] Attending [place], [facility] for [employer], [place]

## 2015-10-19 NOTE — Anesthesia Preprocedure Evaluation (Signed)
Anesthesia Evaluation  Patient identified by MRN, date of birth, ID band Patient awake    Reviewed: Allergy & Precautions, NPO status , Patient's Chart, lab work & pertinent test results  History of Anesthesia Complications Negative for: history of anesthetic complications  Airway Mallampati: II  TM Distance: >3 FB Neck ROM: Full    Dental  (+) Teeth Intact   Pulmonary asthma (exercise-induced, not on medications) , neg sleep apnea, neg recent URI, former smoker,    Pulmonary exam normal breath sounds clear to auscultation       Cardiovascular negative cardio ROS Normal cardiovascular exam Rhythm:Regular Rate:Normal     Neuro/Psych    GI/Hepatic negative GI ROS, Neg liver ROS,   Endo/Other  negative endocrine ROS  Renal/GU negative Renal ROS  negative genitourinary   Musculoskeletal negative musculoskeletal ROS (+)   Abdominal (+) - obese,   Peds negative pediatric ROS (+)  Hematology negative hematology ROS (+)   Anesthesia Other Findings Resolving shingles rash over right breast. Completed treatment with acyclovir 2 days ago. Surgeon aware.  Reproductive/Obstetrics negative OB ROS                             Anesthesia Physical Anesthesia Plan  ASA: II  Anesthesia Plan: General   Post-op Pain Management:    Induction: Intravenous  Airway Management Planned: Oral ETT  Additional Equipment:   Intra-op Plan:   Post-operative Plan: Extubation in OR  Informed Consent: I have reviewed the patients History and Physical, chart, labs and discussed the procedure including the risks, benefits and alternatives for the proposed anesthesia with the patient or authorized representative who has indicated his/her understanding and acceptance.   Dental advisory given  Plan Discussed with: CRNA, Anesthesiologist and Surgeon  Anesthesia Plan Comments:         Anesthesia Quick  Evaluation

## 2015-10-19 NOTE — Anesthesia Procedure Notes (Signed)
Procedure Name: Intubation Date/Time: 10/19/2015 3:01 PM Performed by: Jonna Munro Pre-anesthesia Checklist: Patient identified, Emergency Drugs available, Suction available, Timeout performed and Patient being monitored Patient Re-evaluated:Patient Re-evaluated prior to inductionOxygen Delivery Method: Circle system utilized Preoxygenation: Pre-oxygenation with 100% oxygen Intubation Type: IV induction Laryngoscope Size: Mac and 3 Grade View: Grade I Tube size: 7.0 mm Number of attempts: 1 Airway Equipment and Method: Stylet Placement Confirmation: ETT inserted through vocal cords under direct vision,  positive ETCO2 and breath sounds checked- equal and bilateral Secured at: 21 cm Tube secured with: Tape Dental Injury: Teeth and Oropharynx as per pre-operative assessment

## 2015-10-19 NOTE — H&P (Signed)
Preoperative History and Physical  Christina Arias is a 45 y.o. [id number] here for surgical management of right ovarian dermoid cyst.   No significant preoperative concerns.  Proposed surgery: Laparoscopic right salpingoophorectomy  Past Medical History:  Diagnosis Date  . Asthma    Resolved - ashtma as a child, no problems, no inhaler  . SVD (spontaneous vaginal delivery)    x 6   Past Surgical History:  Procedure Laterality Date  . TUBAL LIGATION     OB History  Gravida Para Term Preterm AB Living  10 6 6   4 4   SAB TAB Ectopic Multiple Live Births    4          # Outcome Date GA Lbr Len/2nd Weight Sex Delivery Anes PTL Lv  10 TAB           9 TAB           8 TAB           7 TAB           6 Term           5 Term           4 Term           3 Term           2 Term           1 Term             Patient denies any other pertinent gynecologic issues.   No current facility-administered medications on file prior to encounter.    Current Outpatient Prescriptions on File Prior to Encounter  Medication Sig Dispense Refill  . naproxen (NAPROSYN) 500 MG tablet Take 1 tablet (500 mg total) by mouth 2 (two) times daily with a meal. As needed for pain (Patient not taking: Reported on 10/05/2015) 60 tablet 2   No Known Allergies  Social History:   reports that she quit smoking about 3 months ago. Her smoking use included Cigarettes. She has a 0.75 pack-year smoking history. She has never used smokeless tobacco. She reports that she drinks alcohol. She reports that she does not use drugs.  History reviewed. No pertinent family history.  Review of Systems: Noncontributory  PHYSICAL EXAM: Height 5' 5.75" (1.67 m), weight 150 lb (68 kg), last menstrual period 09/01/2015. CONSTITUTIONAL: Well-developed, well-nourished female in no acute distress.  HENT:  Normocephalic, atraumatic, External right and left ear normal. Oropharynx is clear and moist EYES: Conjunctivae and EOM are normal.  Pupils are equal, round, and reactive to light. No scleral icterus.  NECK: Normal range of motion, supple, no masses SKIN: Skin is warm and dry. No rash noted. Not diaphoretic. No erythema. No pallor. NEUROLOGIC: Alert and oriented to person, place, and time. Normal reflexes, muscle tone coordination. No cranial nerve deficit noted. PSYCHIATRIC: Normal mood and affect. Normal behavior. Normal judgment and thought content. CARDIOVASCULAR: Normal heart rate noted, regular rhythm RESPIRATORY: Effort and breath sounds normal, no problems with respiration noted ABDOMEN: Soft, nontender, nondistended. PELVIC: Deferred MUSCULOSKELETAL: Normal range of motion. No edema and no tenderness. 2+ distal pulses.  Labs: Results for orders placed or performed during the hospital encounter of 10/19/15 (from the past 336 hour(s))  Pregnancy, urine   Collection Time: 10/19/15  1:45 PM  Result Value Ref Range   Preg Test, Ur NEGATIVE NEGATIVE    Imaging Studies: 08/14/2015  CLINICAL DATA:  Right ovarian cyst seen on CT scan. EXAM: TRANSABDOMINAL  AND TRANSVAGINAL ULTRASOUND OF PELVIS TECHNIQUE: Both transabdominal and transvaginal ultrasound examinations of the pelvis were performed. Transabdominal technique was performed for global imaging of the pelvis including uterus, ovaries, adnexal regions, and pelvic cul-de-sac. It was necessary to proceed with endovaginal exam following the transabdominal exam to visualize the endometrium and ovaries. COMPARISON:  CT scan of same day. FINDINGS: Uterus Measurements: 10.5 x 6.4 x 5.3 cm. No fibroids or other mass visualized. Endometrium Thickness: 7.8 mm which is within normal limits for patient of reproductive age. No focal abnormality visualized. Right ovary Measurements: 4.7 x 4.1 x 2.1 cm. Complex but predominantly solid mass measuring 3.2 x 2.7 x 2.2 cm is noted which corresponds to abnormality seen on CT scan. It has a substantial hyperechoic solid component and is most  consistent with dermoid tumor. Left ovary Measurements: 2.7 x 1.8 x 2.1 cm. Probable small hemorrhagic cyst measuring 1.1 cm is noted. Other findings Trace amount of free fluid is noted which most likely is physiologic. IMPRESSION: 3.2 cm predominantly solid mass with substantial hyperechoic component is noted in right ovary which corresponds to lesion seen on CT scan. This is most consistent with dermoid tumor. If this abnormality is not surgically removed, followup ultrasound in 1 year is recommended to ensure stability. Electronically Signed   By: Marijo Conception, M.D.   On: 08/14/2015 10:44   Assessment: Patient Active Problem List   Diagnosis Date Noted  . Dermoid cyst of right ovary 09/10/2015    Plan: Patient will undergo surgical management with laparoscopic right salpingoophorectomy.   The risks of surgery were discussed in detail with the patient including but not limited to: bleeding which may require transfusion or reoperation; infection which may require antibiotics; injury to surrounding organs which may involve bowel, bladder, ureters ; need for additional procedures including laparotomy; thromboembolic phenomenon, surgical site problems and other postoperative/anesthesia complications. Likelihood of success in alleviating the patient's condition was discussed. Routine postoperative instructions will be reviewed with the patient and her family in detail after surgery.  The patient concurred with the proposed plan, giving informed written consent for the surgery.  Patient has been NPO since last night she will remain NPO for procedure.  Anesthesia and OR aware.  Preoperative prophylactic antibiotics and SCDs ordered on call to the OR.  To OR when ready.   Verita Schneiders, M.D. 10/19/2015 2:39 PM

## 2015-10-19 NOTE — Anesthesia Postprocedure Evaluation (Signed)
Anesthesia Post Note  Patient: Christina Arias  Procedure(s) Performed: Procedure(s) (LRB): LAPAROSCOPIC  Right SALPINGO OOPHORECTOMY (Right)  Patient location during evaluation: PACU Anesthesia Type: General Level of consciousness: awake and alert, oriented and patient cooperative Pain management: pain level controlled Vital Signs Assessment: post-procedure vital signs reviewed and stable Respiratory status: spontaneous breathing, nonlabored ventilation and respiratory function stable Cardiovascular status: blood pressure returned to baseline and stable Postop Assessment: no signs of nausea or vomiting Anesthetic complications: no     Last Vitals:  Vitals:   10/19/15 1730 10/19/15 1745  BP: 133/85 136/83  Pulse: (!) 53 62  Resp: 15 14  Temp:  36.6 C    Last Pain:  Vitals:   10/19/15 1756  PainSc: 4    Pain Goal: Patients Stated Pain Goal: 3 (10/19/15 1756)               Seleta Rhymes. Anisa Leanos

## 2015-10-19 NOTE — Discharge Instructions (Signed)
DISCHARGE INSTRUCTIONS: Laparoscopy  The following instructions have been prepared to help you care for yourself upon your return home today.  Wound care:  Do not get the incision wet for the first 24 hours. The incision should be kept clean and dry.  The Band-Aids or dressings may be removed the day after surgery.  Should the incision become sore, red, and swollen after the first week, check with your doctor.  Personal hygiene:  Shower the day after your procedure.  Activity and limitations:  Do NOT drive or operate any equipment today.  Do NOT lift anything more than 15 pounds for 2-3 weeks after surgery.  Do NOT rest in bed all day.  Walking is encouraged. Walk each day, starting slowly with 5-minute walks 3 or 4 times a day. Slowly increase the length of your walks.  Walk up and down stairs slowly.  Do NOT do strenuous activities, such as golfing, playing tennis, bowling, running, biking, weight lifting, gardening, mowing, or vacuuming for 2-4 weeks. Ask your doctor when it is okay to start.  Diet: Eat a light meal as desired this evening. You may resume your usual diet tomorrow.  Return to work: This is dependent on the type of work you do. For the most part you can return to a desk job within a week of surgery. If you are more active at work, please discuss this with your doctor.  What to expect after your surgery: You may have a slight burning sensation when you urinate on the first day. You may have a very small amount of blood in the urine. Expect to have a small amount of vaginal discharge/light bleeding for 1-2 weeks. It is not unusual to have abdominal soreness and bruising for up to 2 weeks. You may be tired and need more rest for about 1 week. You may experience shoulder pain for 24-72 hours. Lying flat in bed may relieve it.  You may remove the patch behind your ear on or before Friday 10/22/15.  Wash your hands with soap and water after any contact with the  patch.  Do not take ibuprofen/Motrin/Advil products before 9:30pm 10/19/15.  Call your doctor for any of the following:  Develop a fever of 100.4 or greater  Inability to urinate 6 hours after discharge from hospital  Severe pain not relieved by pain medications  Persistent of heavy bleeding at incision site  Redness or swelling around incision site after a week  Increasing nausea or vomiting  Patient Signature________________________________________ Nurse Signature_________________________________________

## 2015-10-21 ENCOUNTER — Encounter (HOSPITAL_COMMUNITY): Payer: Self-pay | Admitting: Obstetrics & Gynecology

## 2015-10-26 ENCOUNTER — Telehealth: Payer: Self-pay | Admitting: *Deleted

## 2015-10-26 NOTE — Telephone Encounter (Signed)
Called pt and left message stating that I am calling with results information. Please call back and state whether a detailed message can be left on your voice mail. Per Dr. Harolyn Rutherford, pt can be informed that her surgical pathology report is benign.

## 2015-11-05 NOTE — Telephone Encounter (Signed)
Letter has been sent

## 2016-01-29 ENCOUNTER — Ambulatory Visit (HOSPITAL_COMMUNITY)
Admission: EM | Admit: 2016-01-29 | Discharge: 2016-01-29 | Disposition: A | Discharge status: Home / Self Care | Payer: Self-pay | Attending: Family Medicine | Admitting: Family Medicine

## 2016-01-29 ENCOUNTER — Encounter (HOSPITAL_COMMUNITY): Payer: Self-pay | Admitting: *Deleted

## 2016-01-29 DIAGNOSIS — K0889 Other specified disorders of teeth and supporting structures: Secondary | ICD-10-CM

## 2016-01-29 DIAGNOSIS — K029 Dental caries, unspecified: Secondary | ICD-10-CM

## 2016-01-29 MED ORDER — PENICILLIN V POTASSIUM 500 MG PO TABS: 500.0000 mg | ORAL_TABLET | Freq: Three times a day (TID) | ORAL | 0 refills | Status: DC

## 2016-01-29 MED ORDER — HYDROCODONE-ACETAMINOPHEN 5-325 MG PO TABS: 1.0000 | ORAL_TABLET | Freq: Four times a day (QID) | ORAL | 0 refills | Status: DC | PRN

## 2016-01-29 NOTE — ED Notes (Addendum)
Patient on the phone, says she call is about a job.  Unable to deliver instructions for discharge

## 2016-01-29 NOTE — Discharge Instructions (Signed)
Christina Arias would be an excellent dentist.  Her office is near [facility]

## 2016-01-29 NOTE — ED Triage Notes (Signed)
C/O right upper and lower dental pain x approx 3 days.  Has tried peroxide rinses, Anbesol.  Denies fevers.

## 2016-01-29 NOTE — ED Provider Notes (Signed)
[facility]    CSN: [id number] Arrival date & time: 01/29/16  1410     History   Chief Complaint Chief Complaint  Patient presents with  . Dental Pain    HPI Caricia Gilford is a 45 y.o. female.   This 45 year old woman who presents to the urgent care Center at [facility] with dental pain.  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 She's recently moved here and does not have a dentist yet. The pain began a couple days ago and now she describes it as throbbing and radiating into her right ear and neck.  She had a similar problem 9 years ago. She recognizes that she has a hole in the tooth #31      Past Medical History:  Diagnosis Date  . Asthma    Resolved - ashtma as a child, no problems, no inhaler  . SVD (spontaneous vaginal delivery)    x 6    Patient Active Problem List   Diagnosis Date Noted  . Dermoid cyst of right ovary 09/10/2015    Past Surgical History:  Procedure Laterality Date  . LAPAROSCOPIC SALPINGO OOPHERECTOMY Right 10/19/2015   Procedure: LAPAROSCOPIC  Right SALPINGO OOPHORECTOMY;  Surgeon: Osborne Oman, MD;  Location: [facility];  Service: Gynecology;  Laterality: Right;    OB History    Gravida Para Term Preterm AB Living   10 6 6   4 4    SAB TAB Ectopic Multiple Live Births     4             Home Medications    Prior to Admission medications   Medication Sig Start Date End Date Taking? Authorizing Provider  Multiple Vitamins-Minerals (ALIVE ONCE DAILY WOMENS PO) Take 1 tablet by mouth daily.   Yes Historical Provider, MD  HYDROcodone-acetaminophen (NORCO) 5-325 MG tablet Take 1 tablet by mouth every 6 (six) hours as needed for moderate pain. 01/29/16   Robyn Haber, MD  penicillin v potassium (VEETID) 500 MG tablet Take 1 tablet (500 mg total) by mouth 3 (three) times daily. 01/29/16   Robyn Haber, MD    Family History No family history on file.  Social History Social History  Substance Use Topics    . Smoking status: Former Smoker    Packs/day: 0.25    Years: 3.00    Types: Cigarettes    Quit date: 07/19/2015  . Smokeless tobacco: Never Used  . Alcohol use Yes     Comment: rarely     Allergies   Patient has no known allergies.   Review of Systems Review of Systems  Constitutional: Negative.   HENT: Positive for dental problem and ear pain.   Respiratory: Negative.   Cardiovascular: Negative.      Physical Exam Triage Vital Signs ED Triage Vitals  Enc Vitals Group     BP 01/29/16 1501 132/71     Pulse Rate 01/29/16 1501 (!) 57     Resp 01/29/16 1501 16     Temp 01/29/16 1501 97.9 F (36.6 C)     Temp Source 01/29/16 1501 Oral     SpO2 01/29/16 1501 100 %     Weight --      Height --      Head Circumference --      Peak Flow --      Pain Score 01/29/16 1505 9     Pain Loc --      Pain Edu? --  Excl. in [place]? --    No data found.   Updated Vital Signs BP 132/71 (BP Location: Left Arm)   Pulse (!) 57   Temp 97.9 F (36.6 C) (Oral)   Resp 16   SpO2 100%    Physical Exam  Constitutional: She is oriented to person, place, and time. She appears well-developed and well-nourished. No distress.  HENT:  Head: Normocephalic and atraumatic.  Right Ear: External ear normal.  Left Ear: External ear normal.  Large carry in tooth #31. Minimal gingival erythema or jaw swelling.  Eyes: Conjunctivae and EOM are normal. Pupils are equal, round, and reactive to light.  Neck: Normal range of motion. Neck supple.  Pulmonary/Chest: Effort normal.  Musculoskeletal: Normal range of motion.  Neurological: She is alert and oriented to person, place, and time.  Psychiatric: She has a normal mood and affect.  Nursing note and vitals reviewed.    UC Treatments / Results  Labs (all labs ordered are listed, but only abnormal results are displayed) Labs Reviewed - No data to display  EKG  EKG Interpretation None       Radiology No results  found.  Procedures Procedures (including critical care time)  Medications Ordered in UC Medications - No data to display   Initial Impression / Assessment and Plan / UC Course  I have reviewed the triage vital signs and the nursing notes.  Pertinent labs & imaging results that were available during my care of the patient were reviewed by me and considered in my medical decision making (see chart for details).  Clinical Course     Final Clinical Impressions(s) / UC Diagnoses   Final diagnoses:  Pain, dental  Dental caries    New Prescriptions New Prescriptions   HYDROCODONE-ACETAMINOPHEN (NORCO) 5-325 MG TABLET    Take 1 tablet by mouth every 6 (six) hours as needed for moderate pain.   PENICILLIN V POTASSIUM (VEETID) 500 MG TABLET    Take 1 tablet (500 mg total) by mouth 3 (three) times daily.     Robyn Haber, MD 01/29/16 [phone]

## 2016-08-10 ENCOUNTER — Encounter: Payer: Self-pay | Admitting: Obstetrics and Gynecology

## 2017-04-17 ENCOUNTER — Encounter: Payer: Self-pay | Admitting: *Deleted

## 2017-05-16 ENCOUNTER — Ambulatory Visit: Payer: Self-pay | Admitting: Obstetrics & Gynecology

## 2018-02-07 ENCOUNTER — Encounter (HOSPITAL_COMMUNITY): Payer: Self-pay | Admitting: Emergency Medicine

## 2018-02-07 ENCOUNTER — Ambulatory Visit (HOSPITAL_COMMUNITY)
Admission: EM | Admit: 2018-02-07 | Discharge: 2018-02-07 | Disposition: A | Discharge status: Home / Self Care | Payer: Self-pay | Attending: Family Medicine | Admitting: Family Medicine

## 2018-02-07 DIAGNOSIS — R21 Rash and other nonspecific skin eruption: Secondary | ICD-10-CM

## 2018-02-07 DIAGNOSIS — B309 Viral conjunctivitis, unspecified: Secondary | ICD-10-CM

## 2018-02-07 MED ORDER — PREDNISONE 20 MG PO TABS: 20.0000 mg | ORAL_TABLET | Freq: Two times a day (BID) | ORAL | 0 refills | Status: DC

## 2018-02-07 NOTE — Discharge Instructions (Signed)
For the eye irritation get a lubricating drop and use this as often as necessary For your rash I am going to give you prednisone to take for a few days It looks like the type of rash that one sees with allergic reactions I expect improvement over the next few days Follow-up with dermatologist if needed

## 2018-02-07 NOTE — ED Provider Notes (Signed)
[facility]    CSN: [id number] Arrival date & time: 02/07/18  0946       History   Chief Complaint Chief Complaint  Patient presents with  . Eye Problem  . Rash    HPI Christina Arias is a 47 y.o. female.   HPI  States she started with some eye itching and drainage a month ago.  She is still having some minor clear tearing from her left eye.  It feels "sandy".  She is using allergy eyedrops without improvement.  She had some redness but this improved.  After she developed eye symptoms, her son did come her daughter did, her husband did.  All of them are in stages of recovery from what ever they are passing around.  In addition she has a rash.  This is been present for 4 weeks.  It appears to be spreading.  She has painful bumps on both legs.  Today she noticed one on her right arm.  Because the rash appears to be spreading, she decided to come to the urgent care.  She has not used any cream or lotion for the rash.  She does not have any itching.  Has not used Benadryl.  No new soap, lotion, powder, or product.  No new medicines.  She is on a new dietary supplement.  Past Medical History:  Diagnosis Date  . Asthma    Resolved - ashtma as a child, no problems, no inhaler  . SVD (spontaneous vaginal delivery)    x 6    Patient Active Problem List   Diagnosis Date Noted  . Dermoid cyst of right ovary 09/10/2015    Past Surgical History:  Procedure Laterality Date  . LAPAROSCOPIC SALPINGO OOPHERECTOMY Right 10/19/2015   Procedure: LAPAROSCOPIC  Right SALPINGO OOPHORECTOMY;  Surgeon: Osborne Oman, MD;  Location: [facility];  Service: Gynecology;  Laterality: Right;    OB History    Gravida  10   Para  6   Term  6   Preterm      AB  4   Living  4     SAB      TAB  4   Ectopic      Multiple      Live Births               Home Medications    Prior to Admission medications   Medication Sig Start Date End Date Taking? Authorizing Provider    Multiple Vitamins-Minerals (ALIVE ONCE DAILY WOMENS PO) Take 1 tablet by mouth daily.    [provider]  predniSONE (DELTASONE) 20 MG tablet Take 1 tablet (20 mg total) by mouth 2 (two) times daily with a meal. 02/07/18   Raylene Everts, MD    Family History No family history on file.  Social History Social History   Tobacco Use  . Smoking status: Former Smoker    Packs/day: 0.25    Years: 3.00    Pack years: 0.75    Types: Cigarettes    Last attempt to quit: 07/19/2015    Years since quitting: 2.5  . Smokeless tobacco: Never Used  Substance Use Topics  . Alcohol use: Yes    Comment: rarely  . Drug use: No     Allergies   Patient has no known allergies.   Review of Systems Review of Systems  Constitutional: Negative for chills and fever.  HENT: Negative for ear pain and sore throat.   Eyes: Positive  for discharge and itching. Negative for pain and visual disturbance.  Respiratory: Negative for cough and shortness of breath.   Cardiovascular: Negative for chest pain and palpitations.  Gastrointestinal: Negative for abdominal pain and vomiting.  Genitourinary: Negative for dysuria and hematuria.  Musculoskeletal: Negative for arthralgias and back pain.  Skin: Positive for rash. Negative for color change.  Neurological: Negative for seizures and syncope.  All other systems reviewed and are negative.    Physical Exam Triage Vital Signs ED Triage Vitals  Enc Vitals Group     BP 02/07/18 1040 119/87     Pulse Rate 02/07/18 1040 72     Resp 02/07/18 1040 16     Temp 02/07/18 1040 (!) 97.3 F (36.3 C)     Temp src --      SpO2 02/07/18 1040 100 %     Weight --      Height --      Head Circumference --      Peak Flow --      Pain Score 02/07/18 1041 0     Pain Loc --      Pain Edu? --      Excl. in [place]? --    No data found.  Updated Vital Signs BP 119/87   Pulse 72   Temp (!) 97.3 F (36.3 C)   Resp 16   SpO2 100%   Visual Acuity Right  Eye Distance:   Left Eye Distance:   Bilateral Distance:    Right Eye Near:   Left Eye Near:    Bilateral Near:     Physical Exam  Constitutional: She appears well-developed and well-nourished. No distress.  HENT:  Head: Normocephalic and atraumatic.  Mouth/Throat: Oropharynx is clear and moist.  Eyes: Pupils are equal, round, and reactive to light. Conjunctivae are normal.  Eyes are clear.  No injection.  Some tearing from the left eye.  Lids are everted.  No foreign body.  Neck: Normal range of motion. Neck supple.  Cardiovascular: Normal rate.  Pulmonary/Chest: Effort normal. No respiratory distress.  Abdominal: Soft. She exhibits no distension.  Musculoskeletal: Normal range of motion. She exhibits no edema.  Neurological: She is alert.  Skin: Skin is warm and dry.  Patient has irregularly-shaped papules that are erythematous and some purplish.  They look like bruises.  They are tender to touch.  See photo       UC Treatments / Results  Labs (all labs ordered are listed, but only abnormal results are displayed) Labs Reviewed - No data to display  EKG None  Radiology No results found.  Procedures Procedures (including critical care time)  Medications Ordered in UC Medications - No data to display  Initial Impression / Assessment and Plan / UC Course  I have reviewed the triage vital signs and the nursing notes.  Pertinent labs & imaging results that were available during my care of the patient were reviewed by me and considered in my medical decision making (see chart for details).     It sounds like the family is in passing a viral conjunctivitis.  I explained her that this should get better on its own.  I am uncertain what is causing her rash.  I have seen in the rash is his drug reactions.  It may be from her new dietary supplements.  Give her a few days of prednisone.  Symptomatic lubricant eyedrops for care of her eye. Final Clinical Impressions(s) / UC  Diagnoses   Final  diagnoses:  Rash and nonspecific skin eruption  Viral conjunctivitis     Discharge Instructions     For the eye irritation get a lubricating drop and use this as often as necessary For your rash I am going to give you prednisone to take for a few days It looks like the type of rash that one sees with allergic reactions I expect improvement over the next few days Follow-up with dermatologist if needed     ED Prescriptions    Medication Sig Dispense Auth. Provider   predniSONE (DELTASONE) 20 MG tablet Take 1 tablet (20 mg total) by mouth 2 (two) times daily with a meal. 10 tablet Raylene Everts, MD     Controlled Substance Prescriptions [place] Controlled Substance Registry consulted? Not Applicable   Raylene Everts, MD 02/07/18 [phone]

## 2018-02-07 NOTE — ED Triage Notes (Signed)
Pt c/o L eye drainage x1 month, and rash all over body. Son also has symtpoms.

## 2018-03-31 ENCOUNTER — Emergency Department (HOSPITAL_COMMUNITY)
Admission: EM | Admit: 2018-03-31 | Discharge: 2018-03-31 | Disposition: A | Discharge status: Home / Self Care | Payer: Self-pay | Attending: Emergency Medicine | Admitting: Emergency Medicine

## 2018-03-31 ENCOUNTER — Encounter (HOSPITAL_COMMUNITY): Payer: Self-pay | Admitting: Pharmacy Technician

## 2018-03-31 ENCOUNTER — Other Ambulatory Visit: Payer: Self-pay

## 2018-03-31 DIAGNOSIS — Z79899 Other long term (current) drug therapy: Secondary | ICD-10-CM | POA: Insufficient documentation

## 2018-03-31 DIAGNOSIS — M5412 Radiculopathy, cervical region: Secondary | ICD-10-CM | POA: Insufficient documentation

## 2018-03-31 DIAGNOSIS — Z87891 Personal history of nicotine dependence: Secondary | ICD-10-CM | POA: Insufficient documentation

## 2018-03-31 DIAGNOSIS — M79602 Pain in left arm: Secondary | ICD-10-CM

## 2018-03-31 MED ORDER — IBUPROFEN 800 MG PO TABS: 800.0000 mg | ORAL_TABLET | Freq: Three times a day (TID) | ORAL | 0 refills | Status: AC

## 2018-03-31 MED ORDER — IBUPROFEN 800 MG PO TABS
800.0000 mg | ORAL_TABLET | Freq: Once | ORAL | Status: AC
  Administered 2018-03-31: 800 mg via ORAL
  Filled 2018-03-31: qty 1

## 2018-03-31 NOTE — ED Triage Notes (Signed)
Pt with L arm pain with tingling. Pt also c/o bruising to bil legs.

## 2018-03-31 NOTE — ED Notes (Signed)
Patient verbalizes understanding of discharge instructions. Opportunity for questioning and answers were provided. Armband removed by staff, pt discharged from ED ambulatory. Pt dressed and refused d/c vitals.

## 2018-03-31 NOTE — ED Provider Notes (Addendum)
[facility] EMERGENCY DEPARTMENT Provider Note   CSN: [id number] Arrival date & time: 03/31/18  1811     History   Chief Complaint Chief Complaint  Patient presents with  . Arm Pain    HPI Christina Arias is a 48 y.o. female.  48 year old female with past medical history including asthma who presents with left arm pain.  Patient states that she has had 2 to 3 weeks of left arm pain that initially began as pain in her left neck and shoulder and then moved down into her arm.  She reports associated tingling and occasional numbness of her left fingers.  She has been able to use her arm but she notes some pain when she raises her arm above her head.  She denies any trauma.  No fevers or recent illness.  She has tried massage, heat therapy, and BenGay cream without much relief.  She uses her arms a lot at work.  The history is provided by the patient.  Arm Pain     Past Medical History:  Diagnosis Date  . Asthma    Resolved - ashtma as a child, no problems, no inhaler  . SVD (spontaneous vaginal delivery)    x 6    Patient Active Problem List   Diagnosis Date Noted  . Dermoid cyst of right ovary 09/10/2015    Past Surgical History:  Procedure Laterality Date  . LAPAROSCOPIC SALPINGO OOPHERECTOMY Right 10/19/2015   Procedure: LAPAROSCOPIC  Right SALPINGO OOPHORECTOMY;  Surgeon: Osborne Oman, MD;  Location: [facility];  Service: Gynecology;  Laterality: Right;     OB History    Gravida  10   Para  6   Term  6   Preterm      AB  4   Living  4     SAB      TAB  4   Ectopic      Multiple      Live Births               Home Medications    Prior to Admission medications   Medication Sig Start Date End Date Taking? Authorizing Provider  ibuprofen (ADVIL,MOTRIN) 800 MG tablet Take 1 tablet (800 mg total) by mouth 3 (three) times daily for 4 days. 03/31/18 04/04/18  Cobain Morici, Wenda Overland, MD  Multiple Vitamins-Minerals (ALIVE ONCE DAILY  WOMENS PO) Take 1 tablet by mouth daily.    [provider]  predniSONE (DELTASONE) 20 MG tablet Take 1 tablet (20 mg total) by mouth 2 (two) times daily with a meal. 02/07/18   Raylene Everts, MD    Family History No family history on file.  Social History Social History   Tobacco Use  . Smoking status: Former Smoker    Packs/day: 0.25    Years: 3.00    Pack years: 0.75    Types: Cigarettes    Last attempt to quit: 07/19/2015    Years since quitting: 2.7  . Smokeless tobacco: Never Used  Substance Use Topics  . Alcohol use: Yes    Comment: rarely  . Drug use: No     Allergies   Patient has no known allergies.   Review of Systems Review of Systems All other systems reviewed and are negative except that which was mentioned in HPI   Physical Exam Updated Vital Signs BP 112/75   Pulse 75   Temp 98.6 F (37 C) (Oral)   Resp 18   SpO2 100%  Physical Exam Vitals signs and nursing note reviewed.  Constitutional:      General: She is not in acute distress.    Appearance: She is well-developed.  HENT:     Head: Normocephalic and atraumatic.     Nose: Nose normal.  Eyes:     Conjunctiva/sclera: Conjunctivae normal.  Neck:     Musculoskeletal: Normal range of motion and neck supple. No muscular tenderness.  Musculoskeletal: Normal range of motion.        General: No swelling, tenderness or deformity.  Skin:    General: Skin is warm and dry.     Capillary Refill: Capillary refill takes less than 2 seconds.  Neurological:     Mental Status: She is alert and oriented to person, place, and time.     Sensory: No sensory deficit.     Motor: No weakness.     Comments: Fluent speech 5/5 strength BUE  Psychiatric:        Judgment: Judgment normal.      ED Treatments / Results  Labs (all labs ordered are listed, but only abnormal results are displayed) Labs Reviewed - No data to display  EKG None  Radiology No results  found.  Procedures Procedures (including critical care time)  Medications Ordered in ED Medications  ibuprofen (ADVIL,MOTRIN) tablet 800 mg (800 mg Oral Given 03/31/18 2104)     Initial Impression / Assessment and Plan / ED Course  I have reviewed the triage vital signs and the nursing notes.  I suspect radiculopathy given that pain started in neck and is now in arm. She has no weakness or neuro deficits on exam to warrant emergent MRI. No skin changes or swelling to suggest infectious process.  Gust trial of high-dose NSAID course and follow-up with PCP for referral for physical therapy and consideration of MRI if no improvement.  Extensively reviewed return precautions and she voiced understanding.  Final Clinical Impressions(s) / ED Diagnoses   Final diagnoses:  Left arm pain  Cervical radiculopathy    ED Discharge Orders         Ordered    ibuprofen (ADVIL,MOTRIN) 800 MG tablet  3 times daily     03/31/18 1959           Shenaya Lebo, Wenda Overland, MD 03/31/18 2344    Rex Kras, Wenda Overland, MD 03/31/18 [phone]

## 2019-08-20 ENCOUNTER — Inpatient Hospital Stay
Admission: RE | Admit: 2019-08-20 | Discharge: 2019-08-20 | Disposition: A | Discharge status: Home / Self Care | Payer: Self-pay | Source: Ambulatory Visit

## 2020-04-02 ENCOUNTER — Other Ambulatory Visit: Payer: Self-pay | Admitting: *Deleted

## 2020-04-02 DIAGNOSIS — Z1231 Encounter for screening mammogram for malignant neoplasm of breast: Secondary | ICD-10-CM

## 2020-05-03 ENCOUNTER — Telehealth: Payer: Self-pay

## 2020-05-03 ENCOUNTER — Encounter: Payer: Medicaid Other | Admitting: Obstetrics & Gynecology

## 2020-05-03 NOTE — Telephone Encounter (Signed)
Called pt; pt states she forgot about appt. Would like to reschedule. States the pelvic pain has decreased but is still present. Would like to know what procedures are available for this. [facility] office notified to reschedule.

## 2020-05-13 ENCOUNTER — Ambulatory Visit (INDEPENDENT_AMBULATORY_CARE_PROVIDER_SITE_OTHER): Payer: BC Managed Care – PPO | Admitting: Family Medicine

## 2020-05-13 ENCOUNTER — Other Ambulatory Visit: Payer: Self-pay

## 2020-05-13 ENCOUNTER — Other Ambulatory Visit (HOSPITAL_COMMUNITY)
Admission: RE | Admit: 2020-05-13 | Discharge: 2020-05-13 | Disposition: A | Discharge status: Home / Self Care | Payer: BC Managed Care – PPO | Source: Ambulatory Visit | Attending: Obstetrics & Gynecology | Admitting: Obstetrics & Gynecology

## 2020-05-13 ENCOUNTER — Encounter: Payer: Self-pay | Admitting: Family Medicine

## 2020-05-13 VITALS — BP 138/97 | HR 66 | Wt 156.5 lb

## 2020-05-13 DIAGNOSIS — Z113 Encounter for screening for infections with a predominantly sexual mode of transmission: Secondary | ICD-10-CM | POA: Diagnosis not present

## 2020-05-13 DIAGNOSIS — G8929 Other chronic pain: Secondary | ICD-10-CM | POA: Diagnosis not present

## 2020-05-13 DIAGNOSIS — Z780 Asymptomatic menopausal state: Secondary | ICD-10-CM | POA: Diagnosis not present

## 2020-05-13 DIAGNOSIS — Z124 Encounter for screening for malignant neoplasm of cervix: Secondary | ICD-10-CM | POA: Diagnosis not present

## 2020-05-13 DIAGNOSIS — R1031 Right lower quadrant pain: Secondary | ICD-10-CM

## 2020-05-13 NOTE — Progress Notes (Signed)
[place] appt scheduled for Monday, 05/24/20 @ 1500; pt to arrive at 1445 with a full bladder. Pt notified via telephone.

## 2020-05-13 NOTE — Progress Notes (Signed)
   GYNECOLOGY PROBLEM  VISIT ENCOUNTER NOTE  Subjective:   Christina Arias is a 50 y.o. [id number] female here for a a problem GYN visit.  Current complaints: Intermittent but at times severe RLQ pain.   Denies abnormal vaginal bleeding, discharge, pelvic pain, problems with intercourse or other gynecologic concerns.    Gynecologic History Patient's last menstrual period was 09/01/2015 (approximate). Contraception: post menopausal status  Health Maintenance Due  Topic Date Due  . Hepatitis C Screening  Never done  . COVID-19 Vaccine (1) Never done  . HIV Screening  Never done  . PAP SMEAR-Modifier  Never done  . COLONOSCOPY (Pts 45-71yrs Insurance coverage will need to be confirmed)  Never done  . INFLUENZA VACCINE  Never done   The following portions of the patient's history were reviewed and updated as appropriate: allergies, current medications, past family history, past medical history, past social history, past surgical history and problem list.  Review of Systems Pertinent items are noted in HPI.   Objective:  BP (!) 138/97   Pulse 66   Wt 156 lb 8 oz (71 kg)   LMP 09/01/2015 (Approximate)   BMI 25.45 kg/m  Gen: well appearing, NAD HEENT: no scleral icterus CV: RR Lung: Normal WOB Ext: warm well perfused Abd: TTP in RLQ but no mass palpated. Otherwise NTND. No masses.  Rectus diastasis present  PELVIC: Normal appearing external genitalia; normal appearing vaginal mucosa and cervix.  No abnormal discharge noted.  Pap smear obtained.  Normal uterine size, no other palpable masses, no uterine or adnexal tenderness.   Assessment and Plan:  1. Screening examination for STD (sexually transmitted disease) - HIV antibody (routine testing w rflx) - Hepatitis C Antibody - Hepatitis B Surface AntiGEN - RPR  2. Pap smear for cervical cancer screening - Cytology - PAP( [place])  3. Chronic RLQ pain Concern for direct hernia that is intermittently having fat or bowel  intrusion. Unable to palpate fascia defect today. Does have rectus diastasis. 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 Thus does have risk factors for hernia  Recommended establishing care with PCP for hernia work up We will r/o worsening/growing dermoid. Known right dermoid from 2017 - US PELVIS (TRANSABDOMINAL ONLY); Future  Please refer to After Visit Summary for other counseling recommendations.   Return in about 1 year (around 05/13/2021) for Yearly wellness exam.  Caren Macadam, MD, MPH, ABFM Attending [place] for [facility]

## 2020-05-14 LAB — HEPATITIS B SURFACE ANTIGEN: Hepatitis B Surface Ag: NEGATIVE

## 2020-05-14 LAB — HEPATITIS C ANTIBODY: Hep C Virus Ab: <0.1 s/co ratio (ref 0.0–0.9)

## 2020-05-14 LAB — RPR: RPR Ser Ql: NONREACTIVE

## 2020-05-14 LAB — HIV ANTIBODY (ROUTINE TESTING W REFLEX): HIV Screen 4th Generation wRfx: NONREACTIVE

## 2020-05-17 LAB — CYTOLOGY - PAP
Chlamydia: NEGATIVE
Comment: NEGATIVE
Comment: NEGATIVE
Comment: NEGATIVE
Comment: NORMAL
Diagnosis: NEGATIVE
High risk HPV: NEGATIVE
Neisseria Gonorrhea: NEGATIVE
Trichomonas: NEGATIVE

## 2020-05-24 ENCOUNTER — Ambulatory Visit: Admission: RE | Admit: 2020-05-24 | Payer: BC Managed Care – PPO | Source: Ambulatory Visit

## 2020-06-01 ENCOUNTER — Encounter: Payer: Self-pay | Admitting: *Deleted

## 2020-07-21 ENCOUNTER — Ambulatory Visit
Admission: RE | Admit: 2020-07-21 | Discharge: 2020-07-21 | Disposition: A | Discharge status: Home / Self Care | Payer: BC Managed Care – PPO | Source: Ambulatory Visit | Attending: Family Medicine | Admitting: Family Medicine

## 2020-07-21 ENCOUNTER — Other Ambulatory Visit: Payer: Self-pay

## 2020-07-21 DIAGNOSIS — G8929 Other chronic pain: Secondary | ICD-10-CM | POA: Insufficient documentation

## 2020-07-21 DIAGNOSIS — R1031 Right lower quadrant pain: Secondary | ICD-10-CM | POA: Diagnosis not present

## 2020-07-26 ENCOUNTER — Telehealth: Payer: Self-pay | Admitting: Lactation Services

## 2020-07-26 DIAGNOSIS — R1031 Right lower quadrant pain: Secondary | ICD-10-CM

## 2020-07-26 DIAGNOSIS — G8929 Other chronic pain: Secondary | ICD-10-CM

## 2020-07-26 DIAGNOSIS — Z9889 Other specified postprocedural states: Secondary | ICD-10-CM

## 2020-07-26 DIAGNOSIS — Z86018 Personal history of other benign neoplasm: Secondary | ICD-10-CM

## 2020-07-26 NOTE — Telephone Encounter (Signed)
Called Radiology Scheduling and scheduled for May 17 at 8 am at [facility], check in at 7:30 at [facility]. Patient is currently driving and would like information sent via My Chart.   Chart forwarded to to Nathaneil Canary for PA.   Called patient and she reports she does not have a PCP. Reviewed she needs to follow up with a PCP about the numbness she is feeling. List of PCP's and Mobile Clinic Providers sent via My Chart.

## 2020-07-26 NOTE — Telephone Encounter (Signed)
[place] unable to visualize adnexa appropriately and patient  with continued sx. Will get MRI

## 2020-08-03 ENCOUNTER — Other Ambulatory Visit: Payer: Self-pay

## 2020-08-03 ENCOUNTER — Ambulatory Visit (HOSPITAL_COMMUNITY)
Admission: RE | Admit: 2020-08-03 | Discharge: 2020-08-03 | Disposition: A | Discharge status: Home / Self Care | Payer: BC Managed Care – PPO | Source: Ambulatory Visit | Attending: Family Medicine | Admitting: Family Medicine

## 2020-08-03 DIAGNOSIS — R1031 Right lower quadrant pain: Secondary | ICD-10-CM | POA: Insufficient documentation

## 2020-08-03 DIAGNOSIS — Z9889 Other specified postprocedural states: Secondary | ICD-10-CM | POA: Insufficient documentation

## 2020-08-03 DIAGNOSIS — G8929 Other chronic pain: Secondary | ICD-10-CM | POA: Diagnosis present

## 2020-08-03 DIAGNOSIS — Z86018 Personal history of other benign neoplasm: Secondary | ICD-10-CM | POA: Insufficient documentation

## 2020-08-03 MED ORDER — GADOBUTROL 1 MMOL/ML IV SOLN
7.0000 mL | Freq: Once | INTRAVENOUS | Status: AC | PRN
  Administered 2020-08-03: 7 mL via INTRAVENOUS

## 2020-08-04 ENCOUNTER — Telehealth: Payer: Self-pay

## 2020-08-04 NOTE — Telephone Encounter (Addendum)
-----   Message from Caren Macadam, MD sent at 08/04/2020  2:47 PM EDT ----- MRI WNL, no concern for dermoid or GYN cause of LLQ pain.    Called pt at both listed numbers; VM left for pt to return call or check MyChart for results.

## 2021-04-28 ENCOUNTER — Other Ambulatory Visit: Payer: Self-pay

## 2021-04-28 ENCOUNTER — Encounter (HOSPITAL_COMMUNITY): Payer: Self-pay | Admitting: Emergency Medicine

## 2021-04-28 ENCOUNTER — Emergency Department (HOSPITAL_COMMUNITY): Payer: BC Managed Care – PPO

## 2021-04-28 ENCOUNTER — Emergency Department (HOSPITAL_COMMUNITY)
Admission: EM | Admit: 2021-04-28 | Discharge: 2021-04-28 | Disposition: A | Discharge status: Home / Self Care | Payer: BC Managed Care – PPO | Attending: Emergency Medicine | Admitting: Emergency Medicine

## 2021-04-28 DIAGNOSIS — Z7982 Long term (current) use of aspirin: Secondary | ICD-10-CM | POA: Insufficient documentation

## 2021-04-28 DIAGNOSIS — R0789 Other chest pain: Secondary | ICD-10-CM | POA: Diagnosis not present

## 2021-04-28 LAB — CBC
HCT: 36.6 % (ref 36.0–46.0)
Hemoglobin: 12.4 g/dL (ref 12.0–15.0)
MCH: 23 pg — ABNORMAL LOW (ref 26.0–34.0)
MCHC: 33.9 g/dL (ref 30.0–36.0)
MCV: 67.9 fL — ABNORMAL LOW (ref 80.0–100.0)
Platelets: 217 10*3/uL (ref 150–400)
RBC: 5.39 MIL/uL — ABNORMAL HIGH (ref 3.87–5.11)
RDW: 13.9 % (ref 11.5–15.5)
WBC: 5 10*3/uL (ref 4.0–10.5)
nRBC: 0 % (ref 0.0–0.2)

## 2021-04-28 LAB — BASIC METABOLIC PANEL
Anion gap: 6 (ref 5–15)
BUN: 20 mg/dL (ref 6–20)
CO2: 24 mmol/L (ref 22–32)
Calcium: 9.1 mg/dL (ref 8.9–10.3)
Chloride: 108 mmol/L (ref 98–111)
Creatinine, Ser: 0.64 mg/dL (ref 0.44–1.00)
GFR, Estimated: >60 mL/min (ref >60)
Glucose, Bld: 81 mg/dL (ref 70–99)
Potassium: 3.9 mmol/L (ref 3.5–5.1)
Sodium: 138 mmol/L (ref 135–145)

## 2021-04-28 LAB — TROPONIN I (HIGH SENSITIVITY)
Troponin I (High Sensitivity): <2 ng/L (ref <18)
Troponin I (High Sensitivity): <2 ng/L (ref <18)

## 2021-04-28 MED ORDER — METHOCARBAMOL 500 MG PO TABS: 500.0000 mg | ORAL_TABLET | Freq: Two times a day (BID) | ORAL | 0 refills | Status: DC

## 2021-04-28 NOTE — ED Provider Triage Note (Signed)
Emergency Medicine Provider Triage Evaluation Note  Mitra Duling , a 51 y.o. female  was evaluated in triage.  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  Patient reports the chest pain episode lasted for about 2 minutes, resolved on its own.  She denies any pain currently.  Patient states that she had taken 2000 mg bayer aspirin prior to this event, reports chronic left shoulder pain.  Review of Systems  Positive: Chest pain, shortness of breath, nausea Negative: Fevers, sore throat, cough, vomiting  Physical Exam  BP 114/74 (BP Location: Right Arm)    Pulse 64    Temp 97.9 F (36.6 C) (Oral)    Resp 18    LMP 09/01/2015 (Approximate)    SpO2 100%  Gen:   Awake, no distress   Resp:  Normal effort  MSK:   Moves extremities without difficulty  Other:  No lower extremity swelling  Medical Decision Making  Medically screening exam initiated at 1:01 PM.  Appropriate orders placed.  Noely Kuhnle was informed that the remainder of the evaluation will be completed by another provider, this initial triage assessment does not replace that evaluation, and the importance of remaining in the ED until their evaluation is complete.     Azucena Cecil, PA-C 04/28/21 1302

## 2021-04-28 NOTE — ED Provider Notes (Signed)
[facility] DEPT Provider Note   CSN: [id number] Arrival date & time: 04/28/21  1207     History  Chief Complaint  Patient presents with   Chest Pain    Christina Arias is a 51 y.o. female.  51 year old female presents with chest pain that began yesterday when she was moving boxes.  Patient states that it was positional worse with certain movements.  No associated dyspnea, diaphoresis, nausea or vomiting.  Pain was better with remaining still.  Since that time, she has remained sore in her chest.  No CHF symptoms.  No treatment use prior to arrival.  No prior history of same      Home Medications Prior to Admission medications   Medication Sig Start Date End Date Taking? Authorizing Provider  albuterol (VENTOLIN HFA) 108 (90 Base) MCG/ACT inhaler Inhale into the lungs. 08/25/19   [provider]  Aspirin-Caffeine (BAYER BACK & BODY PAIN EX ST) 500-32.5 MG TABS Take by mouth.    [provider]  ibuprofen (ADVIL) 200 MG tablet Take 600 mg by mouth every 6 (six) hours as needed.    [provider]  Multiple Vitamins-Minerals (ZINC PO) Take by mouth.    [provider]  Pyridoxine HCl (VITAMIN B-6 PO) Take by mouth.    [provider]  VITAMIN D PO Take by mouth.    [provider]      Allergies    Patient has no known allergies.    Review of Systems   Review of Systems  All other systems reviewed and are negative.  Physical Exam Updated Vital Signs BP 126/89 (BP Location: Right Arm)    Pulse 63    Temp 97.9 F (36.6 C) (Oral)    Resp 18    LMP 09/01/2015 (Approximate)    SpO2 100%  Physical Exam Vitals and nursing note reviewed.  Constitutional:      General: She is not in acute distress.    Appearance: Normal appearance. She is well-developed. She is not toxic-appearing.  HENT:     Head: Normocephalic and atraumatic.  Eyes:     General: Lids are normal.     Conjunctiva/sclera:  Conjunctivae normal.     Pupils: Pupils are equal, round, and reactive to light.  Neck:     Thyroid: No thyroid mass.     Trachea: No tracheal deviation.  Cardiovascular:     Rate and Rhythm: Normal rate and regular rhythm.     Heart sounds: Normal heart sounds. No murmur heard.   No gallop.  Pulmonary:     Effort: Pulmonary effort is normal. No respiratory distress.     Breath sounds: Normal breath sounds. No stridor. No decreased breath sounds, wheezing, rhonchi or rales.  Chest:    Abdominal:     General: There is no distension.     Palpations: Abdomen is soft.     Tenderness: There is no abdominal tenderness. There is no rebound.  Musculoskeletal:        General: No tenderness. Normal range of motion.     Cervical back: Normal range of motion and neck supple.  Skin:    General: Skin is warm and dry.     Findings: No abrasion or rash.  Neurological:     Mental Status: She is alert and oriented to person, place, and time. Mental status is at baseline.     GCS: GCS eye subscore is 4. GCS verbal subscore is 5. GCS motor subscore is  6.     Cranial Nerves: No cranial nerve deficit.     Sensory: No sensory deficit.     Motor: Motor function is intact.  Psychiatric:        Attention and Perception: Attention normal.        Speech: Speech normal.        Behavior: Behavior normal.    ED Results / Procedures / Treatments   Labs (all labs ordered are listed, but only abnormal results are displayed) Labs Reviewed  CBC - Abnormal; Notable for the following components:      Result Value   RBC 5.39 (*)    MCV 67.9 (*)    MCH 23.0 (*)    All other components within normal limits  BASIC METABOLIC PANEL  TROPONIN I (HIGH SENSITIVITY)  TROPONIN I (HIGH SENSITIVITY)    EKG EKG Interpretation  Date/Time:  Thursday April 28 2021 12:53:19 EST Ventricular Rate:  70 PR Interval:  144 QRS Duration: 86 QT Interval:  420 QTC Calculation: 453 R Axis:   90 Text  Interpretation: Normal sinus rhythm Rightward axis Minimal voltage criteria for LVH, may be normal variant ( Sokolow-Lyon ) Borderline ECG No previous ECGs available Confirmed by Lacretia Leigh ([id number]) on 04/28/2021 8:25:37 PM  Radiology DG Chest 2 View  Result Date: 04/28/2021 CLINICAL DATA:  2 minute episode of left-sided chest pain last night while at work. No current complaints. EXAM: CHEST - 2 VIEW COMPARISON:  None. FINDINGS: The heart size and mediastinal contours are within normal limits. Both lungs are clear. The visualized skeletal structures are unremarkable. IMPRESSION: No active cardiopulmonary disease. Electronically Signed   By: Titus Dubin M.D.   On: 04/28/2021 13:49    Procedures Procedures    Medications Ordered in ED Medications - No data to display  ED Course/ Medical Decision Making/ A&P                           Medical Decision Making Amount and/or Complexity of Data Reviewed Labs: ordered. Radiology: ordered.   Patient's EKG per my interpretation without signs of ischemia.  Normal sinus rhythm.  Chest x-ray without evidence of pneumothorax.  Patient has reproducible chest wall tenderness.  Troponin here negative.  Suspect musculoskeletal etiology.  No suspicion for ACS, PE, dissection.  Will discharge home on muscle relaxants        Final Clinical Impression(s) / ED Diagnoses Final diagnoses:  None    Rx / DC Orders ED Discharge Orders     None         Lacretia Leigh, MD 04/28/21 2040

## 2021-04-28 NOTE — ED Triage Notes (Signed)
Patient reports having an episode of chest pain yesterday while moving a heavy box at work. She reports the episode lasted approx 2 minutes and resolved. She reports she feels sore in the chest area today. Reports nausea and SOB.

## 2021-04-28 NOTE — ED Notes (Signed)
An After Visit Summary was printed and given to the patient. Discharge instructions given and no further questions at this time.  

## 2022-01-19 ENCOUNTER — Ambulatory Visit (INDEPENDENT_AMBULATORY_CARE_PROVIDER_SITE_OTHER): Payer: BC Managed Care – PPO

## 2022-01-19 ENCOUNTER — Encounter (HOSPITAL_COMMUNITY): Payer: Self-pay

## 2022-01-19 ENCOUNTER — Ambulatory Visit (HOSPITAL_COMMUNITY)
Admission: EM | Admit: 2022-01-19 | Discharge: 2022-01-19 | Disposition: A | Discharge status: Home / Self Care | Payer: BC Managed Care – PPO | Attending: Internal Medicine | Admitting: Internal Medicine

## 2022-01-19 DIAGNOSIS — R059 Cough, unspecified: Secondary | ICD-10-CM

## 2022-01-19 DIAGNOSIS — J40 Bronchitis, not specified as acute or chronic: Secondary | ICD-10-CM

## 2022-01-19 DIAGNOSIS — R051 Acute cough: Secondary | ICD-10-CM

## 2022-01-19 DIAGNOSIS — R0602 Shortness of breath: Secondary | ICD-10-CM

## 2022-01-19 MED ORDER — METHYLPREDNISOLONE SODIUM SUCC 125 MG IJ SOLR
INTRAMUSCULAR | Status: AC
  Filled 2022-01-19: qty 2

## 2022-01-19 MED ORDER — ALBUTEROL SULFATE HFA 108 (90 BASE) MCG/ACT IN AERS: 1.0000 | INHALATION_SPRAY | Freq: Four times a day (QID) | RESPIRATORY_TRACT | 0 refills | Status: AC | PRN

## 2022-01-19 MED ORDER — METHYLPREDNISOLONE SODIUM SUCC 125 MG IJ SOLR
80.0000 mg | Freq: Once | INTRAMUSCULAR | Status: AC
  Administered 2022-01-19: 80 mg via INTRAMUSCULAR

## 2022-01-19 MED ORDER — PREDNISONE 20 MG PO TABS: 40.0000 mg | ORAL_TABLET | Freq: Every day | ORAL | 0 refills | Status: AC

## 2022-01-19 MED ORDER — BENZONATATE 100 MG PO CAPS: 100.0000 mg | ORAL_CAPSULE | Freq: Three times a day (TID) | ORAL | 0 refills | Status: DC

## 2022-01-19 MED ORDER — GUAIFENESIN ER 1200 MG PO TB12: 1200.0000 mg | ORAL_TABLET | Freq: Two times a day (BID) | ORAL | 0 refills | Status: DC

## 2022-01-19 MED ORDER — IPRATROPIUM-ALBUTEROL 0.5-2.5 (3) MG/3ML IN SOLN
3.0000 mL | Freq: Once | RESPIRATORY_TRACT | Status: AC
  Administered 2022-01-19: 3 mL via RESPIRATORY_TRACT

## 2022-01-19 MED ORDER — IPRATROPIUM-ALBUTEROL 0.5-2.5 (3) MG/3ML IN SOLN
RESPIRATORY_TRACT | Status: AC
  Filled 2022-01-19: qty 3

## 2022-01-19 NOTE — Discharge Instructions (Addendum)
Your chest x-ray is normal. We gave you an injection of steroid today to help with your shortness of breath and inflammation to your lungs.  Start taking prednisone 40 mg once daily tomorrow morning with breakfast for 5 days.  Take guaifenesin every 12 hours to thin mucus.  You can use albuterol inhaler every 4-6 hours as needed for cough and shortness of breath.  You may use Tessalon Perles every 8 hours as needed for cough.  If you develop any new or worsening symptoms or do not improve in the next 2 to 3 days, please return.  If your symptoms are severe, please go to the emergency room.  Follow-up with your primary care provider for further evaluation and management of your symptoms as well as ongoing wellness visits.  I hope you feel better!

## 2022-01-19 NOTE — ED Triage Notes (Signed)
Pt reports a cough x 1 week. Pt reports she is everyday smoker.

## 2022-01-19 NOTE — ED Provider Notes (Signed)
[facility]    CSN: [id number] Arrival date & time: 01/19/22  1818      History   Chief Complaint Chief Complaint  Patient presents with   Cough    HPI Christina Arias is a 51 y.o. female.   Patient presents urgent care for evaluation of cough that has been present for the last week.  She states that her symptoms began with sore throat and chills.  The symptoms have since resolved but cough has lingered.  Cough is productive with yellow/clear sputum and sounds wet.  Cough is worse at nighttime and when laying flat.  She denies fever/chills, nausea, vomiting, dizziness, weakness, headache, blurry vision, and rash.  She is feeling somewhat short of breath with some chest discomfort mostly with coughing.  Reports slight sore throat and states it is worse with coughing.  No nasal congestion, ear pain, or body aches.  Reports history of asthma and lung problems in childhood but states that she has not had very much trouble with this as an adult.  She is a former smoker for 3 years between 2014 and 2017. She recently just picked up smoking again and states that she only smokes 1 to 2 cigarettes/month.  She took a COVID test at home and this was negative.  Denies known sick contacts.  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  She has not attempted use of any over-the-counter medications prior to arrival urgent care for her symptoms.   Cough   Past Medical History:  Diagnosis Date   Asthma    Resolved - ashtma as a child, no problems, no inhaler   SVD (spontaneous vaginal delivery)    x 6    Patient Active Problem List   Diagnosis Date Noted   Post-menopausal 05/13/2020   Dermoid cyst of right ovary 09/10/2015    Past Surgical History:  Procedure Laterality Date   LAPAROSCOPIC SALPINGO OOPHERECTOMY Right 10/19/2015    Procedure: LAPAROSCOPIC  Right SALPINGO OOPHORECTOMY;  Surgeon: Osborne Oman, MD;  Location: [facility];  Service: Gynecology;  Laterality: Right;    OB History     Gravida  10   Para  6   Term  6   Preterm  0   AB  4   Living  5      SAB  0   IAB  4   Ectopic  0   Multiple  0   Live Births  6            Home Medications    Prior to Admission medications   Medication Sig Start Date End Date Taking? Authorizing Provider  benzonatate (TESSALON) 100 MG capsule Take 1 capsule (100 mg total) by mouth every 8 (eight) hours. 01/19/22  Yes Talbot Grumbling, FNP  Guaifenesin 1200 MG TB12 Take 1 tablet (1,200 mg total) by mouth in the morning and at bedtime. 01/19/22  Yes Talbot Grumbling, FNP  predniSONE (DELTASONE) 20 MG tablet Take 2 tablets (40 mg total) by mouth daily for 5 days. 01/19/22 01/24/22 Yes Nimai Burbach, Stasia Cavalier, FNP  albuterol (VENTOLIN HFA) 108 (90 Base) MCG/ACT inhaler Inhale 1-2 puffs into the lungs every 6 (six) hours as needed for wheezing or shortness of breath. 01/19/22   Talbot Grumbling, FNP  Aspirin-Caffeine (BAYER BACK & BODY PAIN EX ST) 500-32.5  MG TABS Take by mouth.    [provider]  ibuprofen (ADVIL) 200 MG tablet Take 600 mg by mouth every 6 (six) hours as needed.    [provider]  methocarbamol (ROBAXIN) 500 MG tablet Take 1 tablet (500 mg total) by mouth 2 (two) times daily. 04/28/21   Lacretia Leigh, MD  Multiple Vitamins-Minerals (ZINC PO) Take by mouth.    [provider]  Pyridoxine HCl (VITAMIN B-6 PO) Take by mouth.    [provider]  VITAMIN D PO Take by mouth.    [provider]    Family History History reviewed. No pertinent family history.  Social History Social History   Tobacco Use   Smoking status: Former    Packs/day: 0.25    Years: 3.00    Total pack years: 0.75    Types: Cigarettes    Quit date: 07/19/2015    Years since quitting: 6.5   Smokeless tobacco:  Never  Substance Use Topics   Alcohol use: Yes    Comment: rarely   Drug use: No     Allergies   Patient has no known allergies.   Review of Systems Review of Systems  Respiratory:  Positive for cough.   Per HPI   Physical Exam Triage Vital Signs ED Triage Vitals [01/19/22 1843]  Enc Vitals Group     BP (!) 146/85     Pulse Rate 73     Resp 16     Temp 99.1 F (37.3 C)     Temp Source Oral     SpO2 99 %     Weight      Height      Head Circumference      Peak Flow      Pain Score      Pain Loc      Pain Edu?      Excl. in [place]?    No data found.  Updated Vital Signs BP (!) 146/85 (BP Location: Left Arm)   Pulse 73   Temp 99.1 F (37.3 C) (Oral)   Resp 16   LMP 09/01/2015 (Approximate)   SpO2 99%   Visual Acuity Right Eye Distance:   Left Eye Distance:   Bilateral Distance:    Right Eye Near:   Left Eye Near:    Bilateral Near:     Physical Exam Vitals and nursing note reviewed.  Constitutional:      Appearance: She is not ill-appearing or toxic-appearing.  HENT:     Head: Normocephalic and atraumatic.     Right Ear: Hearing and external ear normal. There is impacted cerumen.     Left Ear: Hearing and external ear normal. There is impacted cerumen.     Nose: Rhinorrhea present.     Mouth/Throat:     Lips: Pink.     Mouth: Mucous membranes are moist.     Pharynx: Posterior oropharyngeal erythema present.     Comments: Erythematous posterior oropharynx with moderate amount of clear postnasal drainage to the posterior oropharynx.  Airway is intact.  No tonsillar swelling or exudate. Eyes:     General: Lids are normal. Vision grossly intact. Gaze aligned appropriately.        Right eye: No discharge.        Left eye: No discharge.     Extraocular Movements: Extraocular movements intact.     Conjunctiva/sclera: Conjunctivae normal.     Pupils: Pupils are equal, round, and reactive to light.  Cardiovascular:  Rate and Rhythm: Normal rate and  regular rhythm.     Heart sounds: Normal heart sounds, S1 normal and S2 normal.  Pulmonary:     Effort: Pulmonary effort is normal. No respiratory distress.     Breath sounds: Normal air entry. Wheezing and rhonchi present.     Comments: Diffuse expiratory wheeze and rhonchi to all lung fields without acute respiratory distress.  No retractions or increased pulmonary effort. Abdominal:     General: Bowel sounds are normal.     Palpations: Abdomen is soft.     Tenderness: There is no abdominal tenderness. There is no right CVA tenderness, left CVA tenderness or guarding.  Musculoskeletal:     Cervical back: Neck supple.  Skin:    General: Skin is warm and dry.     Capillary Refill: Capillary refill takes less than 2 seconds.     Findings: No rash.  Neurological:     General: No focal deficit present.     Mental Status: She is alert and oriented to person, place, and time. Mental status is at baseline.     Cranial Nerves: No dysarthria or facial asymmetry.     Motor: No weakness.  Psychiatric:        Mood and Affect: Mood normal.        Speech: Speech normal.        Behavior: Behavior normal.        Thought Content: Thought content normal.        Judgment: Judgment normal.      UC Treatments / Results  Labs (all labs ordered are listed, but only abnormal results are displayed) Labs Reviewed - No data to display  EKG   Radiology DG Chest 2 View  Result Date: 01/19/2022 CLINICAL DATA:  Cough EXAM: CHEST - 2 VIEW COMPARISON:  04/28/2021 FINDINGS: The heart size and mediastinal contours are within normal limits. Both lungs are clear. The visualized skeletal structures are unremarkable. IMPRESSION: No active cardiopulmonary disease. Electronically Signed   By: Donavan Foil M.D.   On: 01/19/2022 19:48    Procedures Procedures (including critical care time)  Medications Ordered in UC Medications  ipratropium-albuterol (DUONEB) 0.5-2.5 (3) MG/3ML nebulizer solution 3 mL (3 mLs  Nebulization Given 01/19/22 1915)  methylPREDNISolone sodium succinate (SOLU-MEDROL) 125 mg/2 mL injection 80 mg (80 mg Intramuscular Given 01/19/22 1923)    Initial Impression / Assessment and Plan / UC Course  I have reviewed the triage vital signs and the nursing notes.  Pertinent labs & imaging results that were available during my care of the patient were reviewed by me and considered in my medical decision making (see chart for details).   1.  Acute bronchitis Patient symptoms and physical exam are consistent with acute bronchitis infection.  Chest x-ray is negative for acute cardiopulmonary disease causing symptoms.  DuoNeb and 80 mg Solu-Medrol injection given after initial assessment to reduce inflammation and improve lung sounds/shortness of breath.  Patient reports significant subjective improvement in shortness of breath after breathing treatment and cardiopulmonary exam improved as well.  Albuterol inhaler prescribed to be used every 4-6 hours as needed for cough, shortness of breath, and wheeze.  Prednisone 40 mg burst may be started tomorrow morning, she is to take 40 mg once daily for the next 5 days with food.  Advised to avoid NSAIDs while taking this due to increased risk of GI bleeding.  Patient to take guaifenesin every 12 hours to thin mucus.  She may use Tessalon  Perles every 8 hours as needed for cough.  Work note given.   Discussed physical exam and available lab work findings in clinic with patient.  Counseled patient regarding appropriate use of medications and potential side effects for all medications recommended or prescribed today. Discussed red flag signs and symptoms of worsening condition,when to call the PCP office, return to urgent care, and when to seek higher level of care in the emergency department. Patient verbalizes understanding and agreement with plan. All questions answered. Patient discharged in stable condition.    Final Clinical Impressions(s) / UC  Diagnoses   Final diagnoses:  Bronchitis  Shortness of breath  Acute cough     Discharge Instructions      Your chest x-ray is normal. We gave you an injection of steroid today to help with your shortness of breath and inflammation to your lungs.  Start taking prednisone 40 mg once daily tomorrow morning with breakfast for 5 days.  Take guaifenesin every 12 hours to thin mucus.  You can use albuterol inhaler every 4-6 hours as needed for cough and shortness of breath.  You may use Tessalon Perles every 8 hours as needed for cough.  If you develop any new or worsening symptoms or do not improve in the next 2 to 3 days, please return.  If your symptoms are severe, please go to the emergency room.  Follow-up with your primary care provider for further evaluation and management of your symptoms as well as ongoing wellness visits.  I hope you feel better!     ED Prescriptions     Medication Sig Dispense Auth. Provider   albuterol (VENTOLIN HFA) 108 (90 Base) MCG/ACT inhaler Inhale 1-2 puffs into the lungs every 6 (six) hours as needed for wheezing or shortness of breath. 18 g Joella Prince M, FNP   predniSONE (DELTASONE) 20 MG tablet Take 2 tablets (40 mg total) by mouth daily for 5 days. 10 tablet Joella Prince M, FNP   benzonatate (TESSALON) 100 MG capsule Take 1 capsule (100 mg total) by mouth every 8 (eight) hours. 21 capsule Joella Prince M, FNP   Guaifenesin 1200 MG TB12 Take 1 tablet (1,200 mg total) by mouth in the morning and at bedtime. 14 tablet Talbot Grumbling, FNP      PDMP not reviewed this encounter.   Talbot Grumbling, FNP 01/21/22 1100

## 2022-06-05 IMAGING — US US PELVIS COMPLETE
1 series · 15 of 25 positions shown · non-contrast
Comparison: Prior ultrasound from 08/14/2015.

CLINICAL DATA: Initial evaluation for chronic right lower quadrant
pain. History of prior right salpingo oophorectomy.

EXAM:
TRANSABDOMINAL ULTRASOUND OF PELVIS
TECHNIQUE: Transabdominal ultrasound examination of the pelvis was performed
including evaluation of the uterus, ovaries, adnexal regions, and
pelvic cul-de-sac.

[Series 1: us pelvis complete · 15 of 54 slices shown]
[im 1/54]
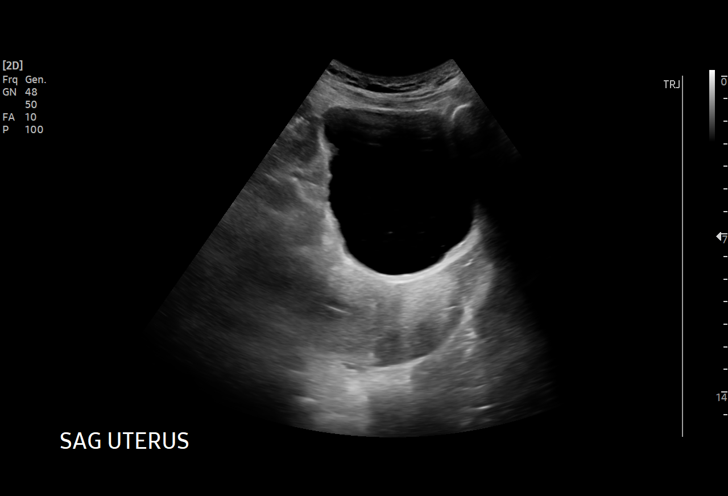
[im 5/54]
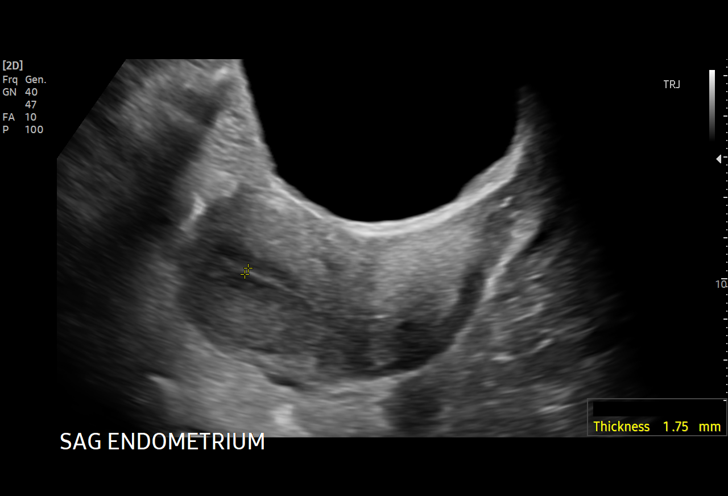
[im 9/54]
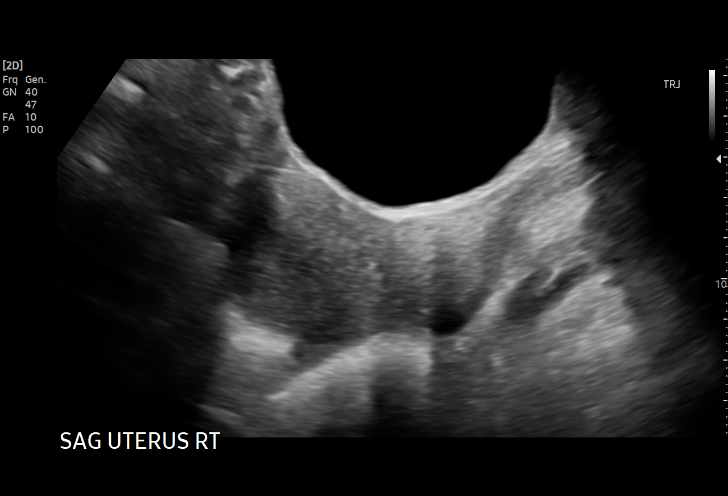
[im 12/54]
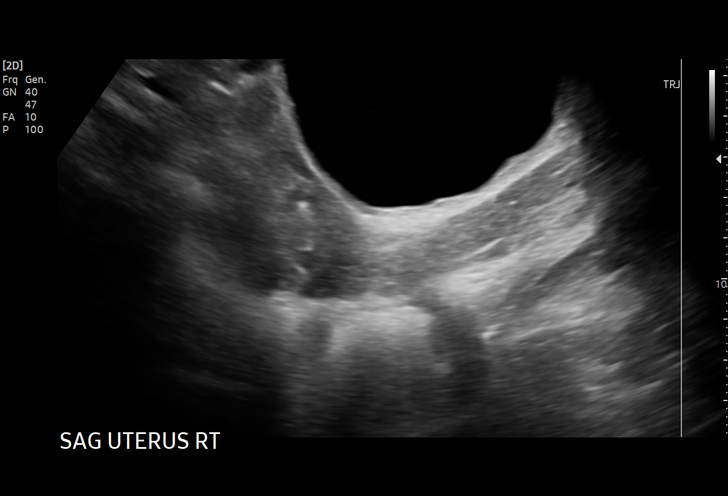
[im 16/54]
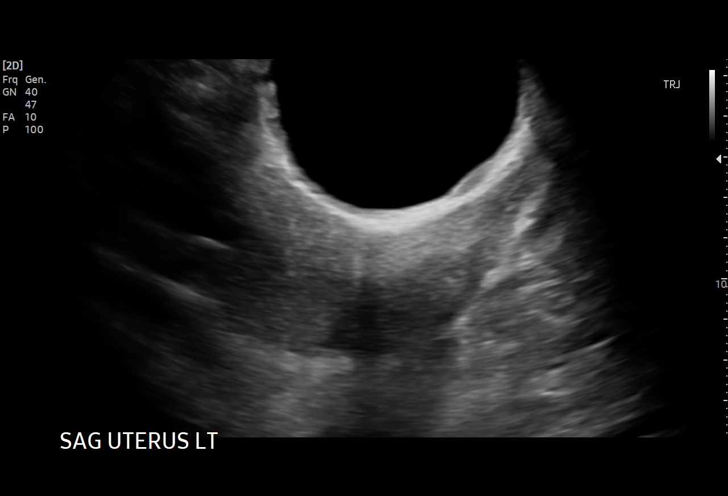
[im 20/54]
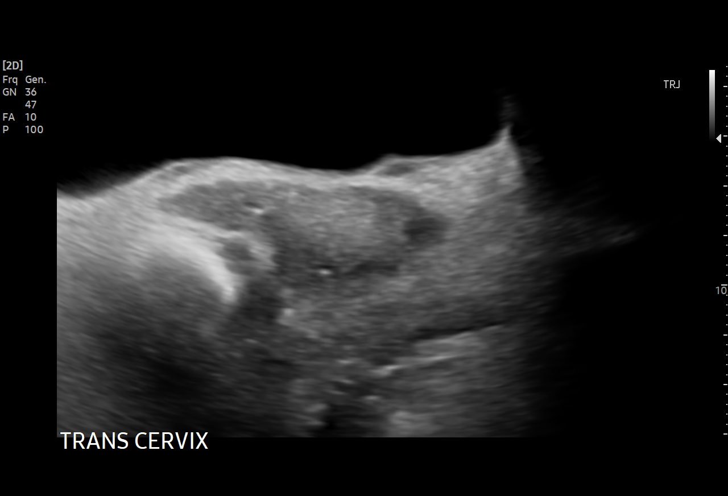
[im 23/54]
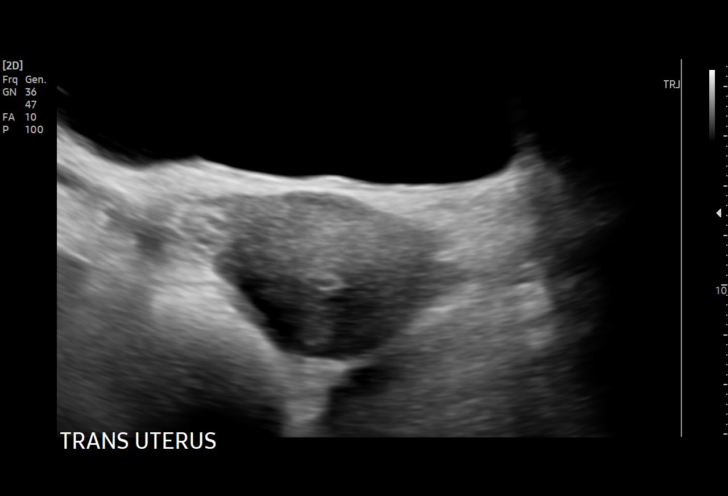
[im 27/54]
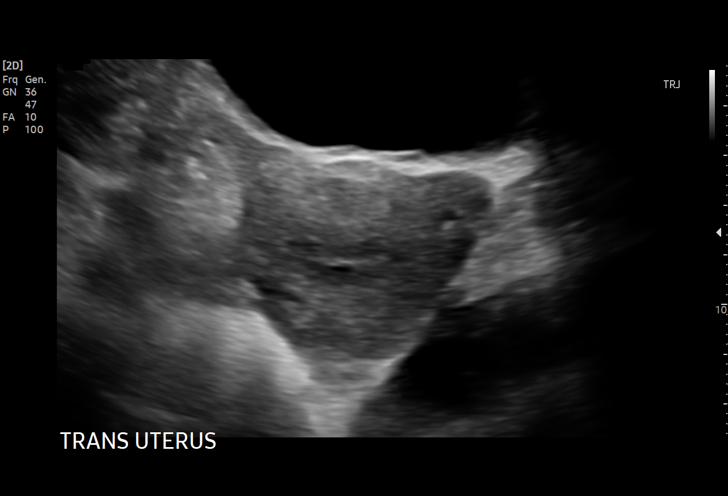
[im 31/54]
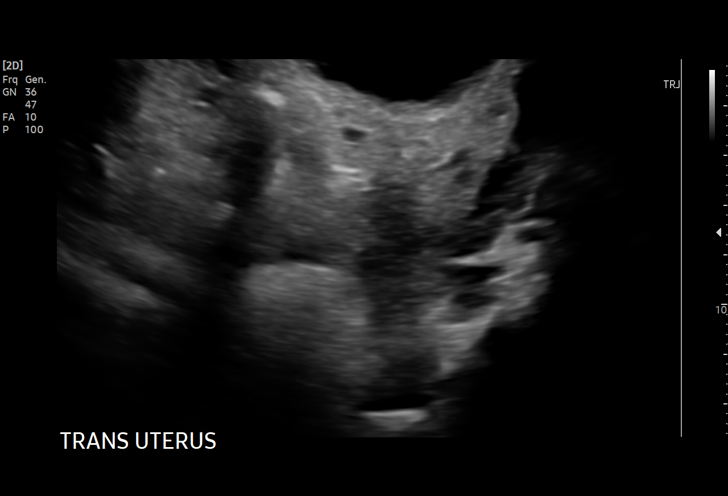
[im 34/54]
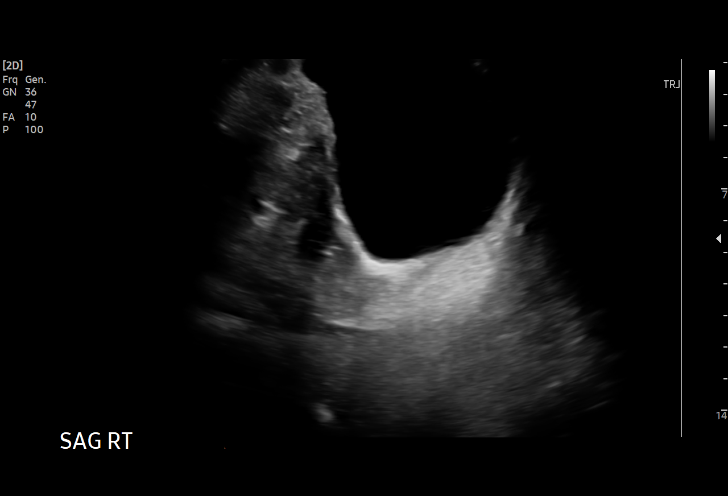
[im 38/54]
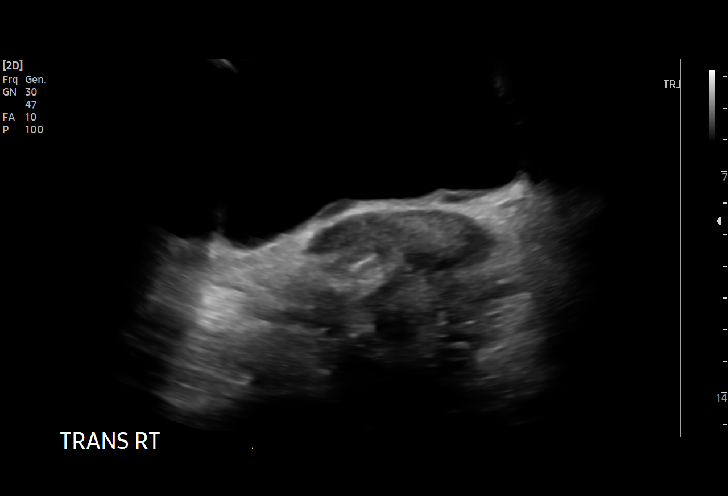
[im 42/54]
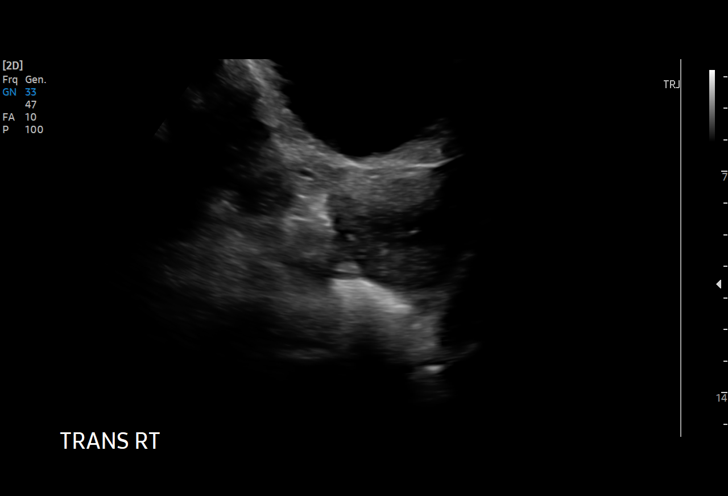
[im 45/54]
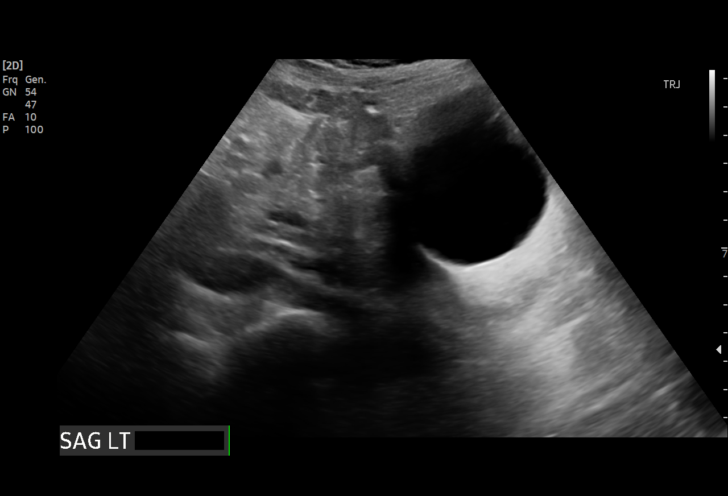
[im 49/54]
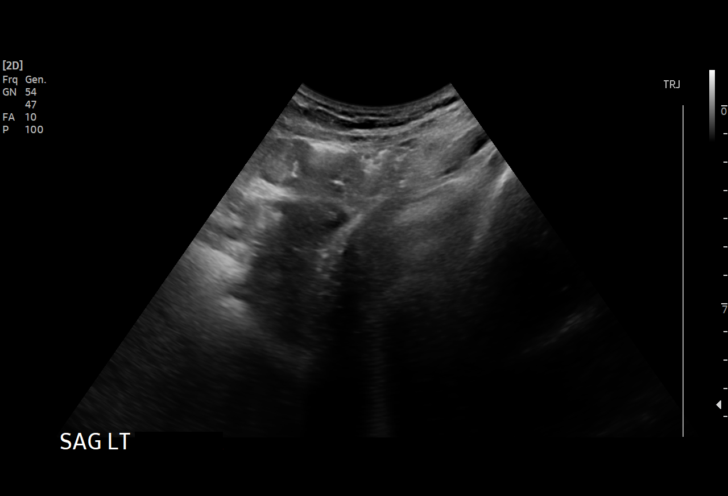
[im 54/54]
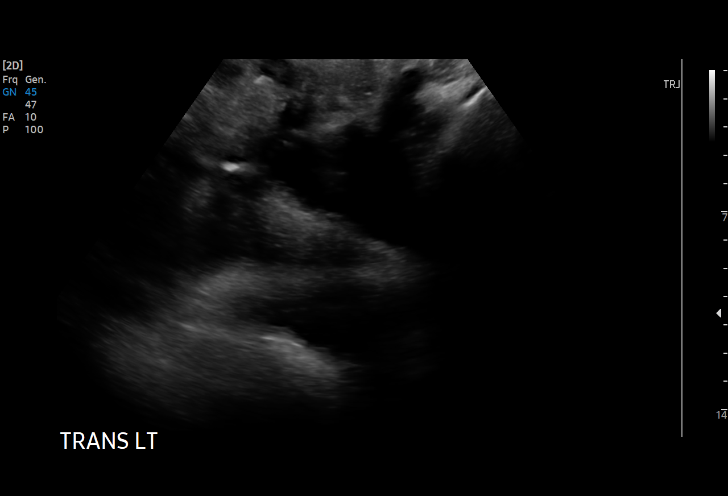

[15 of 25 positions shown; findings below may reference images not displayed]

FINDINGS: Uterus

Measurements: 7.6 x 4.0 x 4.8 cm = volume: 75.7 mL. No fibroids or
other mass visualized.

Endometrium

Thickness: 1.8 mm.  No focal abnormality visualized.

Right ovary

Not visualized.  No adnexal mass.

Left ovary

Not visualized.  No adnexal mass.

Other findings:  No abnormal free fluid.
IMPRESSION: 1. Normal sonographic appearance of the uterus and endometrium.
2. Nonvisualization of either ovary. No adnexal mass or free fluid.
3. No other findings to explain patient's symptoms identified.

## 2023-03-13 IMAGING — CR DG CHEST 2V
2 series · 2 of 2 positions shown · non-contrast
Comparison: None.

CLINICAL DATA: 2 minute episode of left-sided chest pain last night
while at work. No current complaints.

EXAM:
CHEST - 2 VIEW

[w chest pa]
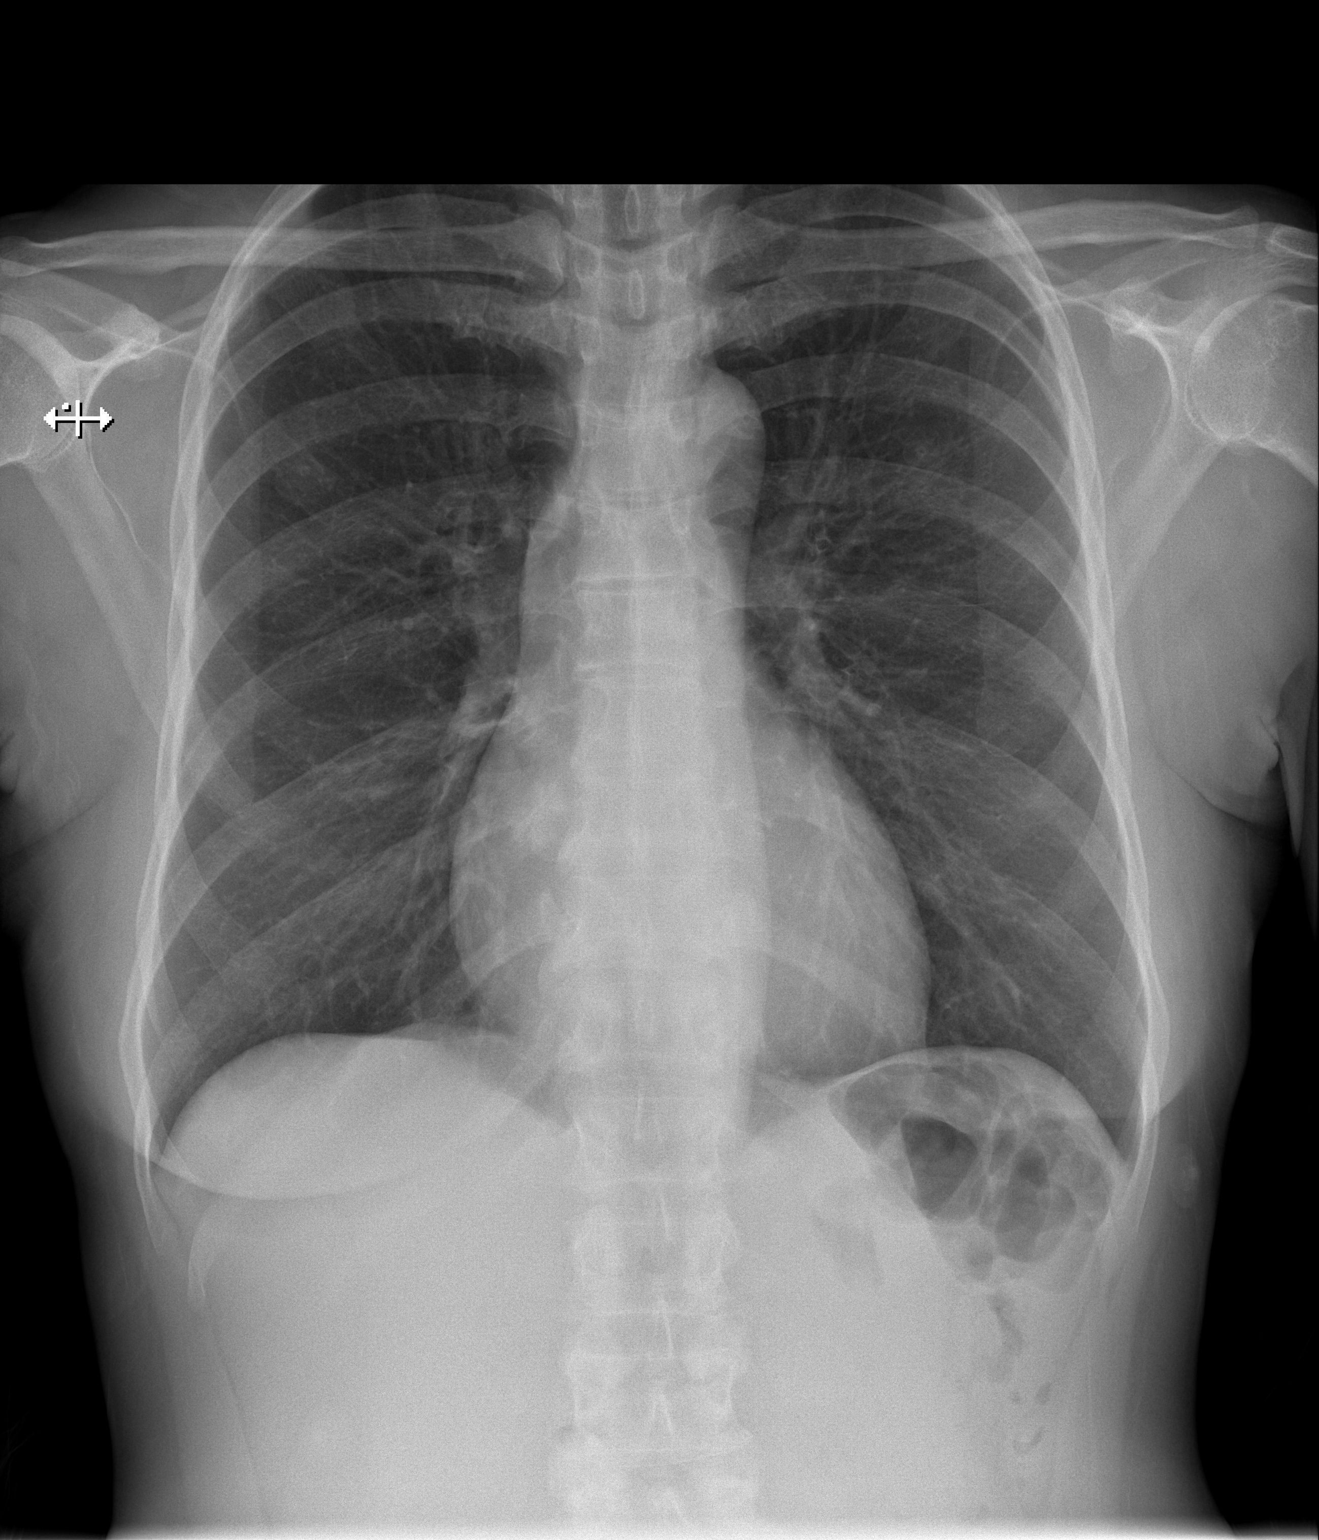

[w chest lat]
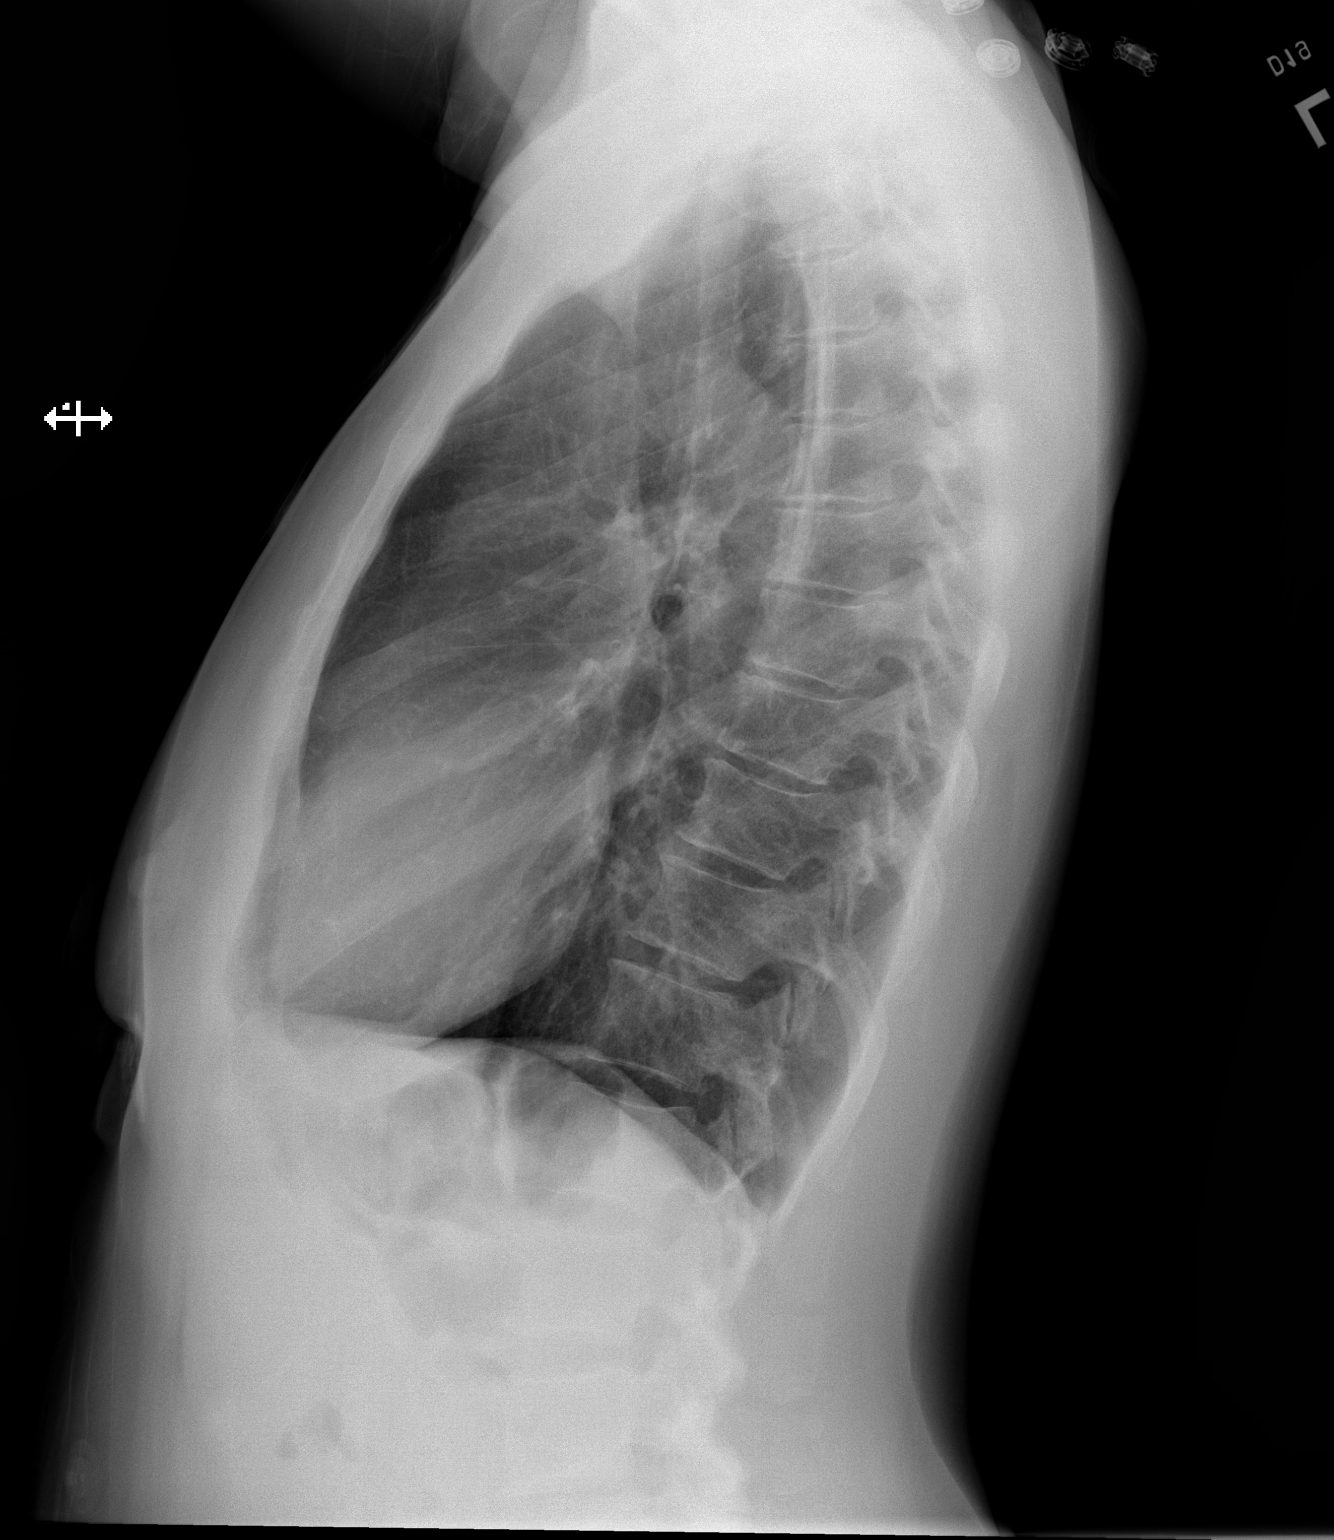

[2 of 2 positions shown; findings below may reference images not displayed]

FINDINGS: The heart size and mediastinal contours are within normal limits.
Both lungs are clear. The visualized skeletal structures are
unremarkable.
IMPRESSION: No active cardiopulmonary disease.

## 2023-03-19 ENCOUNTER — Ambulatory Visit (INDEPENDENT_AMBULATORY_CARE_PROVIDER_SITE_OTHER): Payer: BC Managed Care – PPO | Admitting: Podiatry

## 2023-03-19 ENCOUNTER — Ambulatory Visit (INDEPENDENT_AMBULATORY_CARE_PROVIDER_SITE_OTHER): Payer: BC Managed Care – PPO

## 2023-03-19 ENCOUNTER — Encounter: Payer: Self-pay | Admitting: Podiatry

## 2023-03-19 DIAGNOSIS — M21619 Bunion of unspecified foot: Secondary | ICD-10-CM

## 2023-03-19 DIAGNOSIS — B351 Tinea unguium: Secondary | ICD-10-CM | POA: Diagnosis not present

## 2023-03-19 DIAGNOSIS — M21622 Bunionette of left foot: Secondary | ICD-10-CM

## 2023-03-19 DIAGNOSIS — M21611 Bunion of right foot: Secondary | ICD-10-CM

## 2023-03-19 DIAGNOSIS — M21621 Bunionette of right foot: Secondary | ICD-10-CM

## 2023-03-19 NOTE — Progress Notes (Signed)
Subjective:   Patient ID: Christina Arias, female   DOB: 52 y.o.   MRN: [id number]   HPI Patient states her bunion is getting worse right and she has a lot of pain on the outside of the foot also and has had some other pains but thinks the bunion is the biggest problem right foot.  States that she has tried wider shoes she has tried different modalities but needs to wear steel toe shoe work and patient does not smoke and likes to be active getting married in May.  Also has significant nail disease 1-5 both feet like treated in the future.  Thick yellow brittle nailbeds 1-5 both feet   Review of Systems  All other systems reviewed and are negative.       Objective:  Physical Exam Vitals and nursing note reviewed.  Constitutional:      Appearance: She is well-developed.  Pulmonary:     Effort: Pulmonary effort is normal.  Musculoskeletal:        General: Normal range of motion.  Skin:    General: Skin is warm.  Neurological:     Mental Status: She is alert.     Neurovascular status intact muscle strength found to be adequate range of motion adequate with large prominence first metatarsal head right painful when pressed redness and deviation of the big toe with prominent fifth metatarsal head right with keratotic lesion formation plantar fifth metatarsal painful when pressed with the left foot showing mild deformity nowhere near to the same degree at the right foot.  Good digital perfusion well-oriented x 3     Assessment:  Significant structural bunion deformity right moderate plantar fascial inflammation tailor's bunion right over left with pain with failure to respond conservatively.  Mycotic nail infection 1-5 both feet that at 1 point will need to be treated     Plan:  H&P all conditions reviewed with the patient explained and at this point we discussed treatment options and she is opted for surgery given the intensity of discomfort and pain she is experiencing.  Also has nail  disease and we will start on antifungal but not till after the surgery is done and at this point I allowed her to read consent form for correction going over alternative treatments complications for distal osteotomy and metatarsal osteotomy fifth right.  Patient understands there is no guarantee of success of surgery understands all complications and that recovery will take approximately 8 to 12 weeks.  Patient signed consent form all questions answered today.  I also went ahead and dispensed air fracture walker for the postoperative.  And explained how to use it fitted properly and I want her to get used to it prior to procedure  X-rays indicate that the patient has an elevation of the 1 2 intermetatarsal angle of approximately 15 degrees and an elevation of the 4 5 intermetatarsal angle right

## 2023-03-20 ENCOUNTER — Telehealth: Payer: Self-pay | Admitting: Urology

## 2023-03-20 NOTE — Telephone Encounter (Signed)
Called pt and LM to call back when she is ready to schedule her surgery with Dr. Charlsie Merles.

## 2023-05-08 ENCOUNTER — Ambulatory Visit (INDEPENDENT_AMBULATORY_CARE_PROVIDER_SITE_OTHER): Payer: BC Managed Care – PPO

## 2023-05-08 ENCOUNTER — Ambulatory Visit
Admission: EM | Admit: 2023-05-08 | Discharge: 2023-05-08 | Disposition: A | Discharge status: Home / Self Care | Payer: BC Managed Care – PPO | Attending: Family Medicine | Admitting: Family Medicine

## 2023-05-08 DIAGNOSIS — M25562 Pain in left knee: Secondary | ICD-10-CM | POA: Diagnosis not present

## 2023-05-08 DIAGNOSIS — S86912A Strain of unspecified muscle(s) and tendon(s) at lower leg level, left leg, initial encounter: Secondary | ICD-10-CM | POA: Diagnosis not present

## 2023-05-08 MED ORDER — PREDNISONE 20 MG PO TABS: 40.0000 mg | ORAL_TABLET | Freq: Every day | ORAL | 0 refills | Status: AC

## 2023-05-08 NOTE — ED Provider Notes (Signed)
[facility]    CSN: [id number] Arrival date & time: 05/08/23  1200      History   Chief Complaint No chief complaint on file.   HPI Christina Arias is a 53 y.o. female presents for knee pain.  Patient reports 5 days of left knee pain/swelling.  States that she works at [employer] and goes to [employer] and does a lot of heavy lifting which she thinks has caused the issue.  Denies direct injury such as fall.  She is able to bear weight with pain but is unable to fully extend or flex the knee.  No numbness or tingling.  She does report a history of left knee dislocation/fracture when she was 12 that did require surgery.  She has been using an over-the-counter knee brace, Biofreeze, and aspirin for symptoms.  She does not want to file a Worker's Comp. claim as this is a pre-existing condition.  No other concerns at this time.  HPI  Past Medical History:  Diagnosis Date   Asthma    Resolved - ashtma as a child, no problems, no inhaler   SVD (spontaneous vaginal delivery)    x 6    Patient Active Problem List   Diagnosis Date Noted   Post-menopausal 05/13/2020   Dermoid cyst of right ovary 09/10/2015    Past Surgical History:  Procedure Laterality Date   LAPAROSCOPIC SALPINGO OOPHERECTOMY Right 10/19/2015   Procedure: LAPAROSCOPIC  Right SALPINGO OOPHORECTOMY;  Surgeon: Tereso Newcomer, MD;  Location: [facility];  Service: Gynecology;  Laterality: Right;    OB History     Gravida  10   Para  6   Term  6   Preterm  0   AB  4   Living  5      SAB  0   IAB  4   Ectopic  0   Multiple  0   Live Births  6            Home Medications    Prior to Admission medications   Medication Sig Start Date End Date Taking? Authorizing Provider  predniSONE (DELTASONE) 20 MG tablet Take 2 tablets (40 mg total) by mouth daily with breakfast for 5 days. 05/08/23 05/13/23 Yes Radford Pax, NP  albuterol (VENTOLIN HFA) 108 (90 Base) MCG/ACT inhaler Inhale 1-2 puffs into the  lungs every 6 (six) hours as needed for wheezing or shortness of breath. 01/19/22   Carlisle Beers, FNP  Aspirin-Caffeine (BAYER BACK & BODY PAIN EX ST) 500-32.5 MG TABS Take by mouth.    [provider]  benzonatate (TESSALON) 100 MG capsule Take 1 capsule (100 mg total) by mouth every 8 (eight) hours. 01/19/22   Carlisle Beers, FNP  Guaifenesin 1200 MG TB12 Take 1 tablet (1,200 mg total) by mouth in the morning and at bedtime. 01/19/22   Carlisle Beers, FNP  ibuprofen (ADVIL) 200 MG tablet Take 600 mg by mouth every 6 (six) hours as needed.    [provider]  methocarbamol (ROBAXIN) 500 MG tablet Take 1 tablet (500 mg total) by mouth 2 (two) times daily. 04/28/21   Lorre Nick, MD  Multiple Vitamins-Minerals (ZINC PO) Take by mouth.    [provider]  Pyridoxine HCl (VITAMIN B-6 PO) Take by mouth.    [provider]  VITAMIN D PO Take by mouth.    [provider]    Family History History reviewed. No pertinent family history.  Social  History Social History   Tobacco Use   Smoking status: Former    Current packs/day: 0.00    Average packs/day: 0.3 packs/day for 3.0 years (0.8 ttl pk-yrs)    Types: Cigarettes    Start date: 07/18/2012    Quit date: 07/19/2015    Years since quitting: 7.8   Smokeless tobacco: Never  Substance Use Topics   Alcohol use: Yes    Comment: rarely   Drug use: No     Allergies   Pork-derived products   Review of Systems Review of Systems  Musculoskeletal:        Left knee pain and swelling     Physical Exam Triage Vital Signs ED Triage Vitals  Encounter Vitals Group     BP 05/08/23 1254 (!) 147/85     Systolic BP Percentile --      Diastolic BP Percentile --      Pulse Rate 05/08/23 1254 (!) 55     Resp 05/08/23 1254 16     Temp 05/08/23 1254 97.9 F (36.6 C)     Temp Source 05/08/23 1254 Oral     SpO2 05/08/23 1254 99 %     Weight --      Height --      Head  Circumference --      Peak Flow --      Pain Score 05/08/23 1251 6     Pain Loc --      Pain Education --      Exclude from Growth Chart --    No data found.  Updated Vital Signs BP (!) 147/85 (BP Location: Left Arm)   Pulse (!) 55   Temp 97.9 F (36.6 C) (Oral)   Resp 16   LMP 09/01/2015 (Approximate)   SpO2 99%   Visual Acuity Right Eye Distance:   Left Eye Distance:   Bilateral Distance:    Right Eye Near:   Left Eye Near:    Bilateral Near:     Physical Exam Vitals and nursing note reviewed.  Constitutional:      Appearance: Normal appearance.  HENT:     Head: Normocephalic and atraumatic.  Eyes:     Pupils: Pupils are equal, round, and reactive to light.  Cardiovascular:     Rate and Rhythm: Normal rate.  Pulmonary:     Effort: Pulmonary effort is normal.  Musculoskeletal:     Left knee: Swelling and effusion present. No deformity, erythema, ecchymosis, lacerations, bony tenderness or crepitus. Decreased range of motion. Tenderness present over the medial joint line. Normal alignment and normal patellar mobility. Normal pulse.     Comments: Positive valgus and varus stress test.  Patient cannot fully extend knee.  And can only bend to 90 degrees.  Skin:    General: Skin is warm and dry.  Neurological:     General: No focal deficit present.     Mental Status: She is alert and oriented to person, place, and time.  Psychiatric:        Mood and Affect: Mood normal.        Behavior: Behavior normal.      UC Treatments / Results  Labs (all labs ordered are listed, but only abnormal results are displayed) Labs Reviewed - No data to display  Basic metabolic panel Order: [id number]  Status: Final result     Next appt: None   Test Result Released: Yes (not seen)   0 Result Notes     Component Ref  Range & Units (hover) 2 yr ago 7 yr ago  Sodium 138 137  Potassium 3.9 4.2  Chloride 108 108 R  CO2 24 24  Glucose, Bld 81 121 High  R  Comment: Glucose  reference range applies only to samples taken after fasting for at least 8 hours.  BUN 20 10  Creatinine, Ser 0.64 0.77  Calcium 9.1 9.1  GFR, Estimated >60   Comment: (NOTE) Calculated using the CKD-EPI Creatinine Equation (2021)  Anion gap 6 5  Comment: Performed at [facility], [address] [place], [place] [address]  Resulting Agency [facility]        Specimen Collected: 04/28/21 13:19 Last Resulted: 04/28/21 14:07       EKG   Radiology No results found.  Procedures Procedures (including critical care time)  Medications Ordered in UC Medications - No data to display  Initial Impression / Assessment and Plan / UC Course  I have reviewed the triage vital signs and the nursing notes.  Pertinent labs & imaging results that were available during my care of the patient were reviewed by me and considered in my medical decision making (see chart for details).     Reviewed exam and symptoms with patient.  No red flags.  Wet read of x-ray without obvious fracture does show some swelling.  Will treat as knee strain and contact patient for any positive results based on radiology overread.  Patient states she cannot take NSAIDs, will do prednisone for 5 days.  Patient was fitted with a knee sleeve here that she felt was more supportive.  Discussed RICE therapy and advised orthopedic follow-up.  ER precautions reviewed and patient verbalized understanding. Final Clinical Impressions(s) / UC Diagnoses   Final diagnoses:  Acute pain of left knee  Knee strain, left, initial encounter     Discharge Instructions      You may take prednisone daily for 5 days.  Continue to elevate and ice the knee as needed.  Use the knee sleeve to help support the joint.  Please follow-up with orthopedics such as [facility] for further workup and treatment of your knee pain.  Please go to the ER if you develop any worsening symptoms.  Hope you feel better  soon!     ED Prescriptions     Medication Sig Dispense Auth. Provider   predniSONE (DELTASONE) 20 MG tablet Take 2 tablets (40 mg total) by mouth daily with breakfast for 5 days. 10 tablet Radford Pax, NP      PDMP not reviewed this encounter.   Radford Pax, NP 05/08/23 1346

## 2023-05-08 NOTE — Discharge Instructions (Signed)
You may take prednisone daily for 5 days.  Continue to elevate and ice the knee as needed.  Use the knee sleeve to help support the joint.  Please follow-up with orthopedics such as [facility] for further workup and treatment of your knee pain.  Please go to the ER if you develop any worsening symptoms.  Hope you feel better soon!

## 2023-05-08 NOTE — ED Triage Notes (Signed)
Pt presents to UC for c/o left knee pain, swelling x5 days. Pt states she carries heavy weights at work which exacerbates it. Has tried knee brace, biofreeze, bayer aspirin

## 2023-10-10 ENCOUNTER — Other Ambulatory Visit: Payer: Self-pay

## 2023-10-10 ENCOUNTER — Emergency Department (HOSPITAL_COMMUNITY)
Admission: EM | Admit: 2023-10-10 | Discharge: 2023-10-10 | Disposition: A | Discharge status: Home / Self Care | Attending: Emergency Medicine | Admitting: Emergency Medicine

## 2023-10-10 ENCOUNTER — Encounter (HOSPITAL_COMMUNITY): Payer: Self-pay

## 2023-10-10 DIAGNOSIS — Z7951 Long term (current) use of inhaled steroids: Secondary | ICD-10-CM | POA: Insufficient documentation

## 2023-10-10 DIAGNOSIS — Z7982 Long term (current) use of aspirin: Secondary | ICD-10-CM | POA: Insufficient documentation

## 2023-10-10 DIAGNOSIS — J45909 Unspecified asthma, uncomplicated: Secondary | ICD-10-CM | POA: Diagnosis not present

## 2023-10-10 DIAGNOSIS — M5442 Lumbago with sciatica, left side: Secondary | ICD-10-CM | POA: Insufficient documentation

## 2023-10-10 DIAGNOSIS — M5432 Sciatica, left side: Secondary | ICD-10-CM

## 2023-10-10 MED ORDER — IBUPROFEN 800 MG PO TABS: 800.0000 mg | ORAL_TABLET | Freq: Three times a day (TID) | ORAL | 0 refills | Status: DC

## 2023-10-10 MED ORDER — METHYLPREDNISOLONE 4 MG PO TBPK: ORAL_TABLET | ORAL | 0 refills | Status: DC

## 2023-10-10 MED ORDER — IBUPROFEN 400 MG PO TABS
400.0000 mg | ORAL_TABLET | Freq: Once | ORAL | Status: AC | PRN
  Administered 2023-10-10: 400 mg via ORAL
  Filled 2023-10-10: qty 1

## 2023-10-10 MED ORDER — HYDROCODONE-ACETAMINOPHEN 5-325 MG PO TABS: 1.0000 | ORAL_TABLET | Freq: Four times a day (QID) | ORAL | 0 refills | Status: DC | PRN

## 2023-10-10 MED ORDER — CYCLOBENZAPRINE HCL 5 MG PO TABS: 5.0000 mg | ORAL_TABLET | Freq: Three times a day (TID) | ORAL | 0 refills | Status: DC | PRN

## 2023-10-10 NOTE — ED Triage Notes (Signed)
 Pt came in for 10/10 intermittent bilateral leg pain/spasms x 1 month.  Since Friday pain is worse in left leg.  Shooting pains from groin into lower back and down leg.  She works at [employer] and it is affecting her ability to work.

## 2023-10-10 NOTE — Discharge Instructions (Addendum)
 As we discussed, you likely have sciatica from pinched nerve  I recommend you take Motrin  800 mg 3 times daily  If you have muscle spasm I recommend taking Flexeril  as needed  If you have severe pain please take Vicodin for pain  Please follow-up with your primary care doctor.  You may need to start physical therapy and I have referred you to a spine doctor as well  Return to ER if you have severe back pain or numbness or weakness

## 2023-10-10 NOTE — ED Provider Notes (Signed)
 [facility] Provider Note   CSN: [id number] Arrival date & time: 10/10/23  1449     Patient presents with: Bil Leg Pain   Christina Arias is a 53 y.o. female history of asthma here presenting with back pain.  Patient states that she works for [employer] for 6 years.  She states that she has lower back pain for several weeks now.  She states that over the last 2 weeks it got progressively worse and over the last week she has pain whenever she moves.  She states that the pain radiated down the left leg.  She also has some muscle spasms as well.  She never had any neuroimaging or falls.   The history is provided by the patient.       Prior to Admission medications   Medication Sig Start Date End Date Taking? Authorizing Provider  albuterol  (VENTOLIN  HFA) 108 (90 Base) MCG/ACT inhaler Inhale 1-2 puffs into the lungs every 6 (six) hours as needed for wheezing or shortness of breath. 01/19/22   Christina Dorna HERO, FNP  Aspirin-Caffeine (BAYER BACK & BODY PAIN EX ST) 500-32.5 MG TABS Take by mouth.    [provider]  benzonatate  (TESSALON ) 100 MG capsule Take 1 capsule (100 mg total) by mouth every 8 (eight) hours. 01/19/22   Christina Dorna HERO, FNP  Guaifenesin  1200 MG TB12 Take 1 tablet (1,200 mg total) by mouth in the morning and at bedtime. 01/19/22   Christina Dorna HERO, FNP  ibuprofen  (ADVIL ) 200 MG tablet Take 600 mg by mouth every 6 (six) hours as needed.    [provider]  methocarbamol  (ROBAXIN ) 500 MG tablet Take 1 tablet (500 mg total) by mouth 2 (two) times daily. 04/28/21   Christina Faden, MD  Multiple Vitamins-Minerals (ZINC PO) Take by mouth.    [provider]  Pyridoxine HCl (VITAMIN B-6 PO) Take by mouth.    [provider]  VITAMIN D PO Take by mouth.    [provider]    Allergies: Pork-derived products    Review of Systems  Musculoskeletal:  Positive for back pain.  All other  systems reviewed and are negative.   Updated Vital Signs BP (!) 156/99 (BP Location: Right Arm)   Pulse (!) 58   Temp 97.7 F (36.5 C)   Resp 17   Ht 5' 5.75 (1.67 m)   Wt 65.8 kg   LMP 09/01/2015 (Approximate)   SpO2 100%   BMI 23.58 kg/m   Physical Exam Vitals and nursing note reviewed.  HENT:     Head: Normocephalic.     Nose: Nose normal.     Mouth/Throat:     Mouth: Mucous membranes are moist.  Eyes:     Extraocular Movements: Extraocular movements intact.     Pupils: Pupils are equal, round, and reactive to light.  Cardiovascular:     Rate and Rhythm: Normal rate and regular rhythm.     Pulses: Normal pulses.     Heart sounds: Normal heart sounds.  Pulmonary:     Effort: Pulmonary effort is normal.     Breath sounds: Normal breath sounds.  Abdominal:     General: Abdomen is flat.     Palpations: Abdomen is soft.  Musculoskeletal:     Cervical back: Normal range of motion and neck supple.     Comments: Left paralumbar tenderness.  Patient has positive straight leg raise on the left.  Skin:    General:  Skin is warm.     Capillary Refill: Capillary refill takes less than 2 seconds.  Neurological:     General: No focal deficit present.     Mental Status: She is alert.     Comments: No saddle anesthesia.  Patient has positive straight leg raise on the left.  Patient is able interrelate but prefer the right side  Psychiatric:        Mood and Affect: Mood normal.        Behavior: Behavior normal.     (all labs ordered are listed, but only abnormal results are displayed) Labs Reviewed - No data to display  EKG: None  Radiology: No results found.   Procedures   Medications Ordered in the ED  ibuprofen  (ADVIL ) tablet 400 mg (400 mg Oral Given 10/10/23 1706)                                    Medical Decision Making Christina Arias is a 53 y.o. female here presenting with back pain rating down to the leg.  Patient likely has left sciatica.  I discussed  options and we decided on steroids and muscle relaxants and NSAIDs.  I did recommend that she starts physical therapy and may need to see neurosurgery for follow-up.  If she has worsening symptoms, she should get an outpatient MRI for further evaluation   Problems Addressed: Sciatica of left side: acute illness or injury  Risk Prescription drug management.     Final diagnoses:  None    ED Discharge Orders     None          Christina Alm Macho, MD 10/10/23 2135

## 2023-10-17 ENCOUNTER — Ambulatory Visit (HOSPITAL_COMMUNITY)
Admission: EM | Admit: 2023-10-17 | Discharge: 2023-10-17 | Disposition: A | Discharge status: Home / Self Care | Attending: Family Medicine | Admitting: Family Medicine

## 2023-10-17 ENCOUNTER — Encounter (HOSPITAL_COMMUNITY): Payer: Self-pay

## 2023-10-17 DIAGNOSIS — M5432 Sciatica, left side: Secondary | ICD-10-CM | POA: Diagnosis not present

## 2023-10-17 MED ORDER — CYCLOBENZAPRINE HCL 5 MG PO TABS: 5.0000 mg | ORAL_TABLET | Freq: Three times a day (TID) | ORAL | 0 refills | Status: DC | PRN

## 2023-10-17 MED ORDER — PREDNISONE 10 MG (48) PO TBPK: ORAL_TABLET | ORAL | 0 refills | Status: AC

## 2023-10-17 NOTE — ED Triage Notes (Signed)
 Pt present with upper back pain x two weeks. States she lifts heavy boxes at work and felt pain after pulling a heavy box. She has been taking Flexeril , Ibuprofen  and Norco/Vicodin. Reports the muscle spasms have improved. She has stopped taking Norco. Pt states  she cannot go to work.

## 2023-10-18 NOTE — ED Provider Notes (Signed)
 [facility]   [id number] 10/17/23 Arrival Time: 1943  ASSESSMENT & PLAN:  1. Sciatica of left side    Able to ambulate here and hemodynamically stable. No indication for imaging of back at this time given no trauma and normal neurological exam. Discussed.  Meds ordered this encounter  Medications   predniSONE  (STERAPRED UNI-PAK 48 TAB) 10 MG (48) TBPK tablet    Sig: Take as directed.    Dispense:  48 tablet    Refill:  0   cyclobenzaprine  (FLEXERIL ) 5 MG tablet    Sig: Take 1 tablet (5 mg total) by mouth 3 (three) times daily as needed.    Dispense:  21 tablet    Refill:  0   Work/school excuse note: provided. Medication sedation precautions given. Encourage ROM/movement as tolerated.  Recommend:  Follow-up Information     Schedule an appointment as soon as possible for a visit  with [facility]   Specialty: Specialist Contact information: [address] [place] [address] [phone]                 Reviewed expectations re: course of current medical issues. Questions answered. Outlined signs and symptoms indicating need for more acute intervention. Patient verbalized understanding. After Visit Summary given.   SUBJECTIVE: History from: patient.  Christina Arias is a 53 y.o. female who presents with complaint of lower back pain that occas radiates to legs. Mostly on left. ED visit; flexeril , ibuprofen , norco; did help. Affecting work. Denies extremity sensation changes or weakness.  Current symptoms milder than recent exacerbation requiring ED visit. Normal bowel/bladder habits.   OBJECTIVE:  Vitals:   10/17/23 2017  BP: 129/80  Pulse: 61  Resp: 17  Temp: 97.9 F (36.6 C)  TempSrc: Oral  SpO2: 99%    General appearance: alert; no distress HEENT: ; AT Neck: supple with FROM; without midline tenderness CV: regular Lungs: unlabored respirations; speaks full sentences without difficulty Abdomen: soft,  non-tender; non-distended Back: no specific tenderness to palpation; just hurts; FROM at waist; bruising: none; without midline tenderness Extremities: without edema; symmetrical without gross deformities; normal ROM of all extremities Skin: warm and dry Neurologic: normal gait; normal sensation and strength of all extremities Psychological: alert and cooperative; normal mood and affect  Labs:  Labs Reviewed - No data to display  Imaging: No results found.  Allergies  Allergen Reactions   Pork-Derived Products Itching    Past Medical History:  Diagnosis Date   Asthma    Resolved - ashtma as a child, no problems, no inhaler   SVD (spontaneous vaginal delivery)    x 6   Social History   Socioeconomic History   Marital status: Legally Separated    Spouse name: Not on file   Number of children: Not on file   Years of education: Not on file   Highest education level: Not on file  Occupational History   Not on file  Tobacco Use   Smoking status: Former    Current packs/day: 0.00    Average packs/day: 0.3 packs/day for 3.0 years (0.8 ttl pk-yrs)    Types: Cigarettes    Start date: 07/18/2012    Quit date: 07/19/2015    Years since quitting: 8.2   Smokeless tobacco: Never  Substance and Sexual Activity   Alcohol use: Yes    Comment: rarely   Drug use: No   Sexual activity: Yes  Other Topics Concern   Not on file  Social History Narrative  Not on file   Social Drivers of Health   Financial Resource Strain: Not on file  Food Insecurity: No Food Insecurity (05/13/2020)   Hunger Vital Sign    Worried About Running Out of Food in the Last Year: Never true    Ran Out of Food in the Last Year: Never true  Transportation Needs: No Transportation Needs (05/13/2020)   PRAPARE - Administrator, Civil Service (Medical): No    Lack of Transportation (Non-Medical): No  Physical Activity: Not on file  Stress: Not on file  Social Connections: Not on file  Intimate  Partner Violence: Not on file   History reviewed. No pertinent family history. Past Surgical History:  Procedure Laterality Date   LAPAROSCOPIC SALPINGO OOPHERECTOMY Right 10/19/2015   Procedure: LAPAROSCOPIC  Right SALPINGO OOPHORECTOMY;  Surgeon: Gloris DELENA Hugger, MD;  Location: [facility];  Service: Gynecology;  Laterality: Right;      Rolinda Rogue, MD 10/18/23 [phone]

## 2023-11-02 NOTE — Progress Notes (Unsigned)
 Referring Physician:  Patt Alm Macho, MD [address] [place],  [place] [address]  Primary Physician:  Patient, No Pcp Per  History of Present Illness: 11/08/2023 [name] is here today with a chief complaint of left low back and left hip pain.  She states that this has been ongoing since 2022 when she started her job at [employer].  However over the past month it has become progressively worse to the point of severe.  Pain primarily on the left side of her low back that radiates to the right left groin area.  At times it does extend on top of her thigh to the level of her knee.  She does have some numbness and tingling associated with this as well.  She feels weak in her left hip and states that it is difficult to lift her hip up to get in and out of a car.  She is having intermittent spasms in her left lower extremity including the foot.  She finished a prednisone taper which helped minimally.  In addition, she is using Flexeril to help with her spasms.  No incontinence of bowel or bladder or saddle anesthesia.    Duration: 2022 Modifying factors: made better by laying down. Weakness: none   Conservative measures: chiropractor the last 2 weeks, she was seen chiropractor also in 2022 when symptoms originally started Physical therapy: has not participated in Multimodal medical therapy including regular antiinflammatories:  Prednisone, Flexeril, Ibuprofen, Hydrocodone Injections:  no epidural steroid injections  Past Surgery: no spine surgery  Christina Arias has no symptoms of cervical myelopathy.  The symptoms are causing a significant impact on the patient's life.   Review of Systems:  A 10 point review of systems is negative, except for the pertinent positives and negatives detailed in the HPI.  Past Medical History: Past Medical History:  Diagnosis Date   Asthma    Resolved - ashtma as a child, no problems, no inhaler   SVD (spontaneous vaginal delivery)    x 6     Past Surgical History: Past Surgical History:  Procedure Laterality Date   LAPAROSCOPIC SALPINGO OOPHERECTOMY Right 10/19/2015   Procedure: LAPAROSCOPIC  Right SALPINGO OOPHORECTOMY;  Surgeon: Gloris DELENA Hugger, MD;  Location: [facility];  Service: Gynecology;  Laterality: Right;    Allergies: Allergies as of 11/08/2023 - Review Complete 11/08/2023  Allergen Reaction Noted   Pork-derived products Itching 05/08/2023    Medications: Outpatient Encounter Medications as of 11/08/2023  Medication Sig   albuterol (VENTOLIN HFA) 108 (90 Base) MCG/ACT inhaler Inhale 1-2 puffs into the lungs every 6 (six) hours as needed for wheezing or shortness of breath.   Aspirin-Caffeine (BAYER BACK & BODY PAIN EX ST) 500-32.5 MG TABS Take by mouth.   cyclobenzaprine (FLEXERIL) 5 MG tablet Take 1 tablet (5 mg total) by mouth 3 (three) times daily as needed.   Multiple Vitamins-Minerals (ZINC PO) Take by mouth.   predniSONE (STERAPRED UNI-PAK 48 TAB) 10 MG (48) TBPK tablet Take as directed.   Pyridoxine HCl (VITAMIN B-6 PO) Take by mouth.   VITAMIN D PO Take by mouth.   [DISCONTINUED] benzonatate (TESSALON) 100 MG capsule Take 1 capsule (100 mg total) by mouth every 8 (eight) hours.   [DISCONTINUED] Guaifenesin 1200 MG TB12 Take 1 tablet (1,200 mg total) by mouth in the morning and at bedtime.   [DISCONTINUED] HYDROcodone-acetaminophen (NORCO/VICODIN) 5-325 MG tablet Take 1 tablet by mouth every 6 (six) hours as needed.   [DISCONTINUED] ibuprofen (ADVIL)  800 MG tablet Take 1 tablet (800 mg total) by mouth 3 (three) times daily.   [DISCONTINUED] methocarbamol (ROBAXIN) 500 MG tablet Take 1 tablet (500 mg total) by mouth 2 (two) times daily.   No facility-administered encounter medications on file as of 11/08/2023.    Social History: Social History   Tobacco Use   Smoking status: Former    Current packs/day: 0.00    Average packs/day: 0.3 packs/day for 3.0 years (0.8 ttl pk-yrs)    Types: Cigarettes     Start date: 07/18/2012    Quit date: 07/19/2015    Years since quitting: 8.3   Smokeless tobacco: Never  Vaping Use   Vaping status: Never Used  Substance Use Topics   Alcohol use: Yes    Comment: rarely   Drug use: No    Family Medical History: No family history on file.  Physical Examination: @VITALWITHPAIN @  General: Patient is well developed, well nourished, calm, collected, and in no apparent distress. Attention to examination is appropriate.  Psychiatric: Patient is non-anxious.  Head:  Pupils equal, round, and reactive to light.  ENT:  Oral mucosa appears well hydrated.  Neck:   Supple.  Full range of motion.  Respiratory: Patient is breathing without any difficulty.  Extremities: No edema.  Vascular: Palpable dorsal pedal pulses.  Skin:   On exposed skin, there are no abnormal skin lesions.  NEUROLOGICAL:     Awake, alert, oriented to person, place, and time.  Speech is clear and fluent. Fund of knowledge is appropriate.   Cranial Nerves: Pupils equal round and reactive to light.  Facial tone is symmetric.   ROM of spine: Minimal tenderness palpation of lumbar paraspinals.  Severe pain with internal and external rotation of the left hip.  Strength:  Side Iliopsoas Quads Hamstring PF DF EHL  R 5 5 5 5 5 5   L 2-3 5 5 5 5 5    Clonus is not present.  Toes are down-going.  Bilateral upper and lower extremity sensation is intact to light touch.    Gait is largely normal, some pain limitation noted. Medical Decision Making  Imaging: No outside imaging available to review.  I have personally reviewed the images and agree with the above interpretation.  Assessment and Plan: [name] is a pleasant 53 y.o. female is here today with a chief complaint of left low back and left hip pain.  She states that this has been ongoing since 2022 when she started her job at [employer].  However over the past month it has become progressively worse to the point of severe.  Pain  primarily on the left side of her low back that radiates to the right left groin area.  At times it does extend on top of her thigh to the level of her knee.  She does have some numbness and tingling associated with this as well.  She feels weak in her left hip and states that it is difficult to lift her hip up to get in and out of a car.  She is having intermittent spasms in her left lower extremity including the foot.  She finished a prednisone taper which helped minimally.  In addition, she is using Flexeril to help with her spasms.  On examination she has some left hip weakness as well as significant pain with internal and external rotation of the hip.  This in conjunction with radiating pain to the groin there is a high suspicion for hip pathology.  Differential still  also includes of radiculopathy.  Plan includes the following:  -Dynamic films of lumbar spine as well as x-rays of the patient's left hip. - Patient has physical therapy set up in 3 weeks.  Encourage patient to keep this appointment. - Patient to continue Flexeril and Celebrex also given to help with pain.  Patient due to follow-up with primary care provider in 1 week's time. - Will consider MRI of lumbar spine in the future. - See back in approximately 6 to 8 weeks.   Thank you for involving me in the care of this patient.   I spent a total of 30 minutes in both face-to-face and non-face-to-face activities for this visit on the date of this encounter including preparing to see the patient, obtaining and reviewing separately obtained history, performing medically appropriate examination, counseling the patient, ordering additional medications and tests, documenting clinical information, coordination of care.   Lyle Decamp, PA-C Dept. of Neurosurgery

## 2023-11-06 DIAGNOSIS — M25552 Pain in left hip: Secondary | ICD-10-CM | POA: Insufficient documentation

## 2023-11-06 DIAGNOSIS — M545 Low back pain, unspecified: Secondary | ICD-10-CM | POA: Insufficient documentation

## 2023-11-06 DIAGNOSIS — M5416 Radiculopathy, lumbar region: Secondary | ICD-10-CM | POA: Insufficient documentation

## 2023-11-08 ENCOUNTER — Encounter: Payer: Self-pay | Admitting: Physician Assistant

## 2023-11-08 ENCOUNTER — Ambulatory Visit
Admission: RE | Admit: 2023-11-08 | Discharge: 2023-11-08 | Disposition: A | Discharge status: Home / Self Care | Attending: Physician Assistant | Admitting: Physician Assistant

## 2023-11-08 ENCOUNTER — Ambulatory Visit
Admission: RE | Admit: 2023-11-08 | Discharge: 2023-11-08 | Disposition: A | Discharge status: Home / Self Care | Source: Ambulatory Visit | Attending: Physician Assistant | Admitting: Physician Assistant

## 2023-11-08 ENCOUNTER — Ambulatory Visit: Admitting: Physician Assistant

## 2023-11-08 VITALS — BP 136/74 | Ht 65.75 in | Wt 148.2 lb

## 2023-11-08 DIAGNOSIS — R252 Cramp and spasm: Secondary | ICD-10-CM | POA: Diagnosis not present

## 2023-11-08 DIAGNOSIS — M25552 Pain in left hip: Secondary | ICD-10-CM

## 2023-11-08 DIAGNOSIS — M545 Low back pain, unspecified: Secondary | ICD-10-CM | POA: Diagnosis present

## 2023-11-08 MED ORDER — CELECOXIB 200 MG PO CAPS: 200.0000 mg | ORAL_CAPSULE | Freq: Two times a day (BID) | ORAL | 1 refills | Status: DC

## 2023-11-16 ENCOUNTER — Ambulatory Visit

## 2023-11-16 VITALS — BP 123/84 | HR 62 | Temp 98.2°F | Resp 16 | Ht 65.75 in | Wt 144.0 lb

## 2023-11-16 DIAGNOSIS — M5416 Radiculopathy, lumbar region: Secondary | ICD-10-CM | POA: Diagnosis not present

## 2023-11-16 DIAGNOSIS — M545 Low back pain, unspecified: Secondary | ICD-10-CM | POA: Diagnosis not present

## 2023-11-16 DIAGNOSIS — Z7689 Persons encountering health services in other specified circumstances: Secondary | ICD-10-CM

## 2023-11-16 MED ORDER — CELECOXIB 200 MG PO CAPS: 200.0000 mg | ORAL_CAPSULE | Freq: Two times a day (BID) | ORAL | 1 refills | Status: AC

## 2023-11-16 MED ORDER — CYCLOBENZAPRINE HCL 5 MG PO TABS: 5.0000 mg | ORAL_TABLET | Freq: Three times a day (TID) | ORAL | 0 refills | Status: AC | PRN

## 2023-11-16 NOTE — Progress Notes (Signed)
 Patient ID: Christina Arias, female    DOB: March 15, 1971  MRN: [id number]  CC: Establish Care (Patient is here to established care with provider./~health hx address/~care gaps address/ )   Subjective:0 Christina Arias is a 53 y.o. female with no pertinent family history who presents to clinic to establish care. Pt reports she has been out of work since late July due to worsening back pain with sciatica. Currently being evaluated by chiropractor, orthopedist and has an appointment with neurosurgery upcoming on 09/05 for evaluation. Attends chiropractor visits 3 times a week and finds some pain relief. Works for [employer] but has been out of work due to back pain.  Last CPE: over 3 years ago Last eye exam: over 3 years ago. Wear reading glasses Last dental exam: 2024  Last pap: over 3 years LMP: 53yrs old Last Mammogram: Overdue    Patient Active Problem List   Diagnosis Date Noted   Lumbar radiculopathy 11/06/2023   Pain in left hip 11/06/2023   Pain of lumbar spine 11/06/2023   Post-menopausal 05/13/2020   Dermoid cyst of right ovary 09/10/2015     Current Outpatient Medications on File Prior to Visit  Medication Sig Dispense Refill   albuterol  (VENTOLIN  HFA) 108 (90 Base) MCG/ACT inhaler Inhale 1-2 puffs into the lungs every 6 (six) hours as needed for wheezing or shortness of breath. 18 g 0   Aspirin-Caffeine (BAYER BACK & BODY PAIN EX ST) 500-32.5 MG TABS Take by mouth.     Multiple Vitamins-Minerals (ZINC PO) Take by mouth.     predniSONE  (STERAPRED UNI-PAK 48 TAB) 10 MG (48) TBPK tablet Take as directed. 48 tablet 0   Pyridoxine HCl (VITAMIN B-6 PO) Take by mouth.     VITAMIN D PO Take by mouth.     No current facility-administered medications on file prior to visit.    Allergies  Allergen Reactions   Pork-Derived Products Itching    And diarrhea    Social History   Socioeconomic History   Marital status: Legally Separated    Spouse name: Not on file   Number of  children: Not on file   Years of education: Not on file   Highest education level: Not on file  Occupational History   Not on file  Tobacco Use   Smoking status: Former    Current packs/day: 0.00    Average packs/day: 0.3 packs/day for 3.0 years (0.8 ttl pk-yrs)    Types: Cigarettes    Start date: 07/18/2012    Quit date: 07/19/2015    Years since quitting: 8.3   Smokeless tobacco: Never  Vaping Use   Vaping status: Never Used  Substance and Sexual Activity   Alcohol use: Yes    Comment: rarely   Drug use: No   Sexual activity: Yes  Other Topics Concern   Not on file  Social History Narrative   Not on file   Social Drivers of Health   Financial Resource Strain: Medium Risk (11/16/2023)   Overall Financial Resource Strain (CARDIA)    Difficulty of Paying Living Expenses: Somewhat hard  Food Insecurity: Food Insecurity Present (11/16/2023)   Hunger Vital Sign    Worried About Running Out of Food in the Last Year: Sometimes true    Ran Out of Food in the Last Year: Sometimes true  Transportation Needs: No Transportation Needs (11/16/2023)   PRAPARE - Administrator, Civil Service (Medical): No    Lack of Transportation (Non-Medical): No  Physical Activity: Inactive (11/16/2023)   Exercise Vital Sign    Days of Exercise per Week: 0 days    Minutes of Exercise per Session: 0 min  Stress: No Stress Concern Present (11/16/2023)   [employer] of Occupational Health - Occupational Stress Questionnaire    Feeling of Stress: Not at all  Social Connections: Moderately Integrated (11/16/2023)   Social Connection and Isolation Panel    Frequency of Communication with Friends and Family: Twice a week    Frequency of Social Gatherings with Friends and Family: Twice a week    Attends Religious Services: 1 to 4 times per year    Active Member of [employer] or Organizations: No    Attends Banker Meetings: Never    Marital Status: Living with partner  Intimate  Partner Violence: Not At Risk (11/16/2023)   Humiliation, Afraid, Rape, and Kick questionnaire    Fear of Current or Ex-Partner: No    Emotionally Abused: No    Physically Abused: No    Sexually Abused: No    History reviewed. No pertinent family history.  Past Surgical History:  Procedure Laterality Date   LAPAROSCOPIC SALPINGO OOPHERECTOMY Right 10/19/2015   Procedure: LAPAROSCOPIC  Right SALPINGO OOPHORECTOMY;  Surgeon: Gloris DELENA Hugger, MD;  Location: [facility];  Service: Gynecology;  Laterality: Right;    ROS: Review of Systems Negative except as stated above  PHYSICAL EXAM: BP 123/84   Pulse 62   Temp 98.2 F (36.8 C) (Oral)   Resp 16   Ht 5' 5.75 (1.67 m)   Wt 144 lb (65.3 kg)   LMP 09/01/2015 (Approximate)   SpO2 97%   BMI 23.42 kg/m   Physical Exam  General: well-appearing, no acute distress Skin: no jaundice, rashes, or lesions Cardiovascular: regular heart rate and rhythm, normal S1/S2, no murmurs, gallops, or rubs, peripheral pulses 2+ bilaterally Chest: no skeletal deformity, lungs clear to auscultation bilaterally, equal breath sounds bilaterally Musculoskeletal: antalgic gait Extremities: no peripheral edema   ASSESSMENT AND PLAN:  1. Encounter to establish care with new provider (Primary) - Plan to perform yearly labs and physical next visit 2. Pain of lumbar spine - Awaiting recommendations by neurosurgery (Appt 9/5)  - Seeing chiropractor and orthopedist for this issue.  - celecoxib  (CELEBREX ) 200 MG capsule; Take 1 capsule (200 mg total) by mouth 2 (two) times daily.  Dispense: 60 capsule; Refill: 1 - cyclobenzaprine  (FLEXERIL ) 5 MG tablet; Take 1 tablet (5 mg total) by mouth 3 (three) times daily as needed.  Dispense: 15 tablet; Refill: 0  3. Lumbar radiculopathy - celecoxib  (CELEBREX ) 200 MG capsule; Take 1 capsule (200 mg total) by mouth 2 (two) times daily.  Dispense: 60 capsule; Refill: 1 - cyclobenzaprine  (FLEXERIL ) 5 MG tablet; Take 1 tablet  (5 mg total) by mouth 3 (three) times daily as needed.  Dispense: 15 tablet; Refill: 0    Patient was given the opportunity to ask questions.  Patient verbalized understanding of the plan and was able to repeat key elements of the plan.    No orders of the defined types were placed in this encounter.    Requested Prescriptions   Signed Prescriptions Disp Refills   celecoxib  (CELEBREX ) 200 MG capsule 60 capsule 1    Sig: Take 1 capsule (200 mg total) by mouth 2 (two) times daily.   cyclobenzaprine  (FLEXERIL ) 5 MG tablet 15 tablet 0    Sig: Take 1 tablet (5 mg total) by mouth 3 (three) times daily as needed.  Return in 2 weeks (on 11/30/2023) for physical, labs.  Sula Leavy Rode, PA-C

## 2023-11-20 ENCOUNTER — Ambulatory Visit: Payer: Self-pay | Admitting: Physician Assistant

## 2023-11-20 ENCOUNTER — Other Ambulatory Visit: Payer: Self-pay | Admitting: Physician Assistant

## 2023-11-20 DIAGNOSIS — M25552 Pain in left hip: Secondary | ICD-10-CM

## 2023-12-03 ENCOUNTER — Ambulatory Visit

## 2023-12-27 ENCOUNTER — Encounter

## 2024-01-21 ENCOUNTER — Encounter: Payer: Self-pay | Admitting: Radiology
